# Patient Record
Sex: Female | Born: 1965 | Race: Black or African American | Hispanic: No | Smoking: Never smoker
Health system: Southern US, Community
[De-identification: ages and names within clinical notes are randomized; demographics above are authoritative.]

## PROBLEM LIST (undated history)

## (undated) DIAGNOSIS — D649 Anemia, unspecified: Secondary | ICD-10-CM

## (undated) DIAGNOSIS — E785 Hyperlipidemia, unspecified: Secondary | ICD-10-CM

## (undated) DIAGNOSIS — IMO0002 Reserved for concepts with insufficient information to code with codable children: Secondary | ICD-10-CM

## (undated) DIAGNOSIS — K219 Gastro-esophageal reflux disease without esophagitis: Secondary | ICD-10-CM

## (undated) HISTORY — DX: Hyperlipidemia, unspecified: E78.5

## (undated) HISTORY — DX: Anemia, unspecified: D64.9

## (undated) HISTORY — DX: Reserved for concepts with insufficient information to code with codable children: IMO0002

## (undated) HISTORY — DX: Gastro-esophageal reflux disease without esophagitis: K21.9

---

## 1991-07-22 HISTORY — PX: TUBAL LIGATION: SHX77

## 2004-03-29 ENCOUNTER — Other Ambulatory Visit: Admission: RE | Admit: 2004-03-29 | Discharge: 2004-03-29 | Payer: Self-pay | Admitting: Family Medicine

## 2004-04-09 ENCOUNTER — Encounter: Admission: RE | Admit: 2004-04-09 | Discharge: 2004-04-09 | Payer: Self-pay | Admitting: Family Medicine

## 2005-04-22 ENCOUNTER — Other Ambulatory Visit: Admission: RE | Admit: 2005-04-22 | Discharge: 2005-04-22 | Payer: Self-pay | Admitting: Family Medicine

## 2009-11-02 ENCOUNTER — Emergency Department (HOSPITAL_COMMUNITY): Admission: EM | Admit: 2009-11-02 | Discharge: 2009-11-02 | Payer: Self-pay | Admitting: Emergency Medicine

## 2013-05-16 ENCOUNTER — Ambulatory Visit (INDEPENDENT_AMBULATORY_CARE_PROVIDER_SITE_OTHER): Payer: 59 | Admitting: Emergency Medicine

## 2013-05-16 VITALS — BP 127/92 | HR 99 | Temp 98.3°F | Resp 18 | Ht 65.0 in | Wt 139.0 lb

## 2013-05-16 DIAGNOSIS — D649 Anemia, unspecified: Secondary | ICD-10-CM

## 2013-05-16 DIAGNOSIS — R112 Nausea with vomiting, unspecified: Secondary | ICD-10-CM

## 2013-05-16 DIAGNOSIS — E860 Dehydration: Secondary | ICD-10-CM

## 2013-05-16 DIAGNOSIS — R000 Tachycardia, unspecified: Secondary | ICD-10-CM

## 2013-05-16 LAB — POCT CBC
Hemoglobin: 9.6 g/dL — AB (ref 12.2–16.2)
Lymph, poc: 2.7 (ref 0.6–3.4)
MCH, POC: 28.2 pg (ref 27–31.2)
Platelet Count, POC: 143 10*3/uL (ref 142–424)
RBC: 3.4 M/uL — AB (ref 4.04–5.48)
RDW, POC: 13.3 %

## 2013-05-16 LAB — POCT UA - MICROSCOPIC ONLY
Mucus, UA: NEGATIVE
Yeast, UA: NEGATIVE

## 2013-05-16 LAB — POCT URINALYSIS DIPSTICK
Bilirubin, UA: NEGATIVE
Glucose, UA: NEGATIVE
Nitrite, UA: NEGATIVE
Protein, UA: NEGATIVE
pH, UA: 5.5

## 2013-05-16 LAB — IFOBT (OCCULT BLOOD): IFOBT: POSITIVE

## 2013-05-16 MED ORDER — RANITIDINE HCL 150 MG PO TABS: 300.0000 mg | ORAL_TABLET | Freq: Two times a day (BID) | ORAL | Status: DC

## 2013-05-16 NOTE — Progress Notes (Signed)
  Subjective:    Patient ID: BRIAN KOCOUREK, female    DOB: 03/13/1966, 47 y.o.   MRN: [id number]  HPI Patients presents today complaining of Nausea, vomiting, diarrhea and dizziness. This all started last night around mid night. She has a rapid heartbeat today and still complains of dizziness. He is employed at an [employer]. She has not ever had this happen in the past. Her stomach does not hurt.     Review of Systems     Objective:   Physical Exam patient is alert and cooperative. Her neck is supple. Her chest was clear to auscultation and percussion. Heart was a rapid rate and regular. The abdomen is soft nontender. Liver spleen are not enlarged   Results for orders placed in visit on 05/16/13  POCT CBC      Result Value Range   WBC 10.2  4.6 - 10.2 K/uL   Lymph, poc 2.7  0.6 - 3.4   POC LYMPH PERCENT 26.7  10 - 50 %L   MID (cbc) 0.4  0 - 0.9   POC MID % 4.1  0 - 12 %M   POC Granulocyte 7.1 (*) 2 - 6.9   Granulocyte percent 69.2  37 - 80 %G   RBC 3.40 (*) 4.04 - 5.48 M/uL   Hemoglobin 9.6 (*) 12.2 - 16.2 g/dL   HCT, POC 40.9 (*) 81.1 - 47.9 %   MCV 91.3  80 - 97 fL   MCH, POC 28.2  27 - 31.2 pg   MCHC 31.0 (*) 31.8 - 35.4 g/dL   RDW, POC 91.4     Platelet Count, POC 143  142 - 424 K/uL   MPV 13.3  0 - 99.8 fL  IFOBT (OCCULT BLOOD)      Result Value Range   IFOBT Positive          Assessment & Plan:  Patient presents with one episode of nausea vomiting diarrhea. She denies any blood in her stool. She does have a heme positive stool and hemoglobin of 9.6 with normal indices . Patient will force fluids tonight she will followup with Dr. Loreta Ave tomorrow morning. She was given one dose of Zantac 300 mg here tonight .

## 2013-05-17 LAB — COMPREHENSIVE METABOLIC PANEL
AST: 23 U/L (ref 0–37)
Albumin: 4.4 g/dL (ref 3.5–5.2)
BUN: 22 mg/dL (ref 6–23)
CO2: 24 mEq/L (ref 19–32)
Calcium: 9.8 mg/dL (ref 8.4–10.5)
Chloride: 105 mEq/L (ref 96–112)
Glucose, Bld: 102 mg/dL — ABNORMAL HIGH (ref 70–99)
Potassium: 4.2 mEq/L (ref 3.5–5.3)
Sodium: 140 mEq/L (ref 135–145)
Total Protein: 7.3 g/dL (ref 6.0–8.3)

## 2013-05-17 LAB — IRON AND TIBC
%SAT: 24 % (ref 20–55)
TIBC: 327 ug/dL (ref 250–470)
UIBC: 249 ug/dL (ref 125–400)

## 2013-05-26 ENCOUNTER — Other Ambulatory Visit: Payer: Self-pay

## 2014-02-14 ENCOUNTER — Encounter: Payer: BC Managed Care – PPO | Admitting: Emergency Medicine

## 2014-02-20 ENCOUNTER — Encounter: Payer: Self-pay | Admitting: Emergency Medicine

## 2014-03-03 ENCOUNTER — Ambulatory Visit (INDEPENDENT_AMBULATORY_CARE_PROVIDER_SITE_OTHER): Payer: BC Managed Care – PPO | Admitting: Emergency Medicine

## 2014-03-03 ENCOUNTER — Encounter: Payer: Self-pay | Admitting: Emergency Medicine

## 2014-03-03 VITALS — BP 110/72 | HR 76 | Temp 97.6°F | Resp 16 | Ht 65.0 in | Wt 147.0 lb

## 2014-03-03 DIAGNOSIS — B9681 Helicobacter pylori [H. pylori] as the cause of diseases classified elsewhere: Secondary | ICD-10-CM | POA: Insufficient documentation

## 2014-03-03 DIAGNOSIS — Z23 Encounter for immunization: Secondary | ICD-10-CM

## 2014-03-03 DIAGNOSIS — N951 Menopausal and female climacteric states: Secondary | ICD-10-CM

## 2014-03-03 DIAGNOSIS — A048 Other specified bacterial intestinal infections: Secondary | ICD-10-CM

## 2014-03-03 DIAGNOSIS — R82998 Other abnormal findings in urine: Secondary | ICD-10-CM

## 2014-03-03 DIAGNOSIS — R8281 Pyuria: Secondary | ICD-10-CM

## 2014-03-03 DIAGNOSIS — Z1239 Encounter for other screening for malignant neoplasm of breast: Secondary | ICD-10-CM

## 2014-03-03 DIAGNOSIS — K2970 Gastritis, unspecified, without bleeding: Secondary | ICD-10-CM

## 2014-03-03 DIAGNOSIS — Z1211 Encounter for screening for malignant neoplasm of colon: Secondary | ICD-10-CM

## 2014-03-03 DIAGNOSIS — Z0000 Encounter for general adult medical examination without abnormal findings: Secondary | ICD-10-CM

## 2014-03-03 DIAGNOSIS — K2940 Chronic atrophic gastritis without bleeding: Secondary | ICD-10-CM

## 2014-03-03 DIAGNOSIS — D62 Acute posthemorrhagic anemia: Secondary | ICD-10-CM | POA: Insufficient documentation

## 2014-03-03 DIAGNOSIS — K279 Peptic ulcer, site unspecified, unspecified as acute or chronic, without hemorrhage or perforation: Secondary | ICD-10-CM

## 2014-03-03 LAB — COMPLETE METABOLIC PANEL WITH GFR
ALBUMIN: 4.5 g/dL (ref 3.5–5.2)
ALK PHOS: 58 U/L (ref 39–117)
ALT: 16 U/L (ref 0–35)
AST: 21 U/L (ref 0–37)
BILIRUBIN TOTAL: 0.6 mg/dL (ref 0.2–1.2)
BUN: 12 mg/dL (ref 6–23)
CALCIUM: 9.7 mg/dL (ref 8.4–10.5)
CHLORIDE: 106 meq/L (ref 96–112)
CO2: 29 mEq/L (ref 19–32)
CREATININE: 1.09 mg/dL (ref 0.50–1.10)
GFR, Est African American: 69 mL/min
GFR, Est Non African American: 60 mL/min
Glucose, Bld: 94 mg/dL (ref 70–99)
POTASSIUM: 4.8 meq/L (ref 3.5–5.3)
SODIUM: 143 meq/L (ref 135–145)
Total Protein: 7.9 g/dL (ref 6.0–8.3)

## 2014-03-03 LAB — CBC WITH DIFFERENTIAL/PLATELET
BASOS ABS: 0 10*3/uL (ref 0.0–0.1)
BASOS PCT: 0 % (ref 0–1)
Eosinophils Absolute: 0.1 10*3/uL (ref 0.0–0.7)
Eosinophils Relative: 2 % (ref 0–5)
HCT: 39.4 % (ref 36.0–46.0)
HEMOGLOBIN: 13 g/dL (ref 12.0–15.0)
LYMPHS ABS: 1.5 10*3/uL (ref 0.7–4.0)
Lymphocytes Relative: 34 % (ref 12–46)
MCH: 28.6 pg (ref 26.0–34.0)
MCHC: 33 g/dL (ref 30.0–36.0)
MCV: 86.6 fL (ref 78.0–100.0)
MONOS PCT: 7 % (ref 3–12)
Monocytes Absolute: 0.3 10*3/uL (ref 0.1–1.0)
Neutro Abs: 2.6 10*3/uL (ref 1.7–7.7)
Neutrophils Relative %: 57 % (ref 43–77)
PLATELETS: 162 10*3/uL (ref 150–400)
RBC: 4.55 MIL/uL (ref 3.87–5.11)
RDW: 13.5 % (ref 11.5–15.5)
WBC: 4.5 10*3/uL (ref 4.0–10.5)

## 2014-03-03 LAB — POCT URINALYSIS DIPSTICK
Bilirubin, UA: NEGATIVE
GLUCOSE UA: NEGATIVE
KETONES UA: NEGATIVE
NITRITE UA: NEGATIVE
PH UA: 7
Protein, UA: NEGATIVE
Spec Grav, UA: <=1.005
UROBILINOGEN UA: 0.2

## 2014-03-03 LAB — LIPID PANEL
CHOL/HDL RATIO: 2.2 ratio
Cholesterol: 206 mg/dL — ABNORMAL HIGH (ref 0–200)
HDL: 93 mg/dL (ref >39)
LDL CALC: 103 mg/dL — AB (ref 0–99)
TRIGLYCERIDES: 49 mg/dL (ref <150)
VLDL: 10 mg/dL (ref 0–40)

## 2014-03-03 NOTE — Progress Notes (Deleted)
   Subjective:    Patient ID: Lacey Herrera, female    DOB: [birth year], 48 y.o.   MRN: [id number]  HPI    Review of Systems  Constitutional: Negative.   HENT: Negative.   Eyes: Negative.   Respiratory: Negative.   Cardiovascular: Negative.   Gastrointestinal: Negative.   Endocrine: Negative.   Genitourinary: Negative.   Musculoskeletal: Negative.   Skin: Negative.   Allergic/Immunologic: Negative.   Neurological: Negative.   Hematological: Negative.   Psychiatric/Behavioral: Negative.        Objective:   Physical Exam        Assessment & Plan:

## 2014-03-03 NOTE — Progress Notes (Signed)
@[email or web address] @  Patient ID: Lacey Herrera MRN: [id number], DOB: Oct 28, 1965, 48 y.o. Date of Encounter: 03/03/2014, 11:07 AM  Primary Physician: No PCP Per Patient  Chief Complaint: Physical (CPE)  HPI: 48 y.o. y/o female with history of noted below here for CPE.  Doing well. No issues/complaints.  Pap last done 5 years ago MMG: Not sure when this was last Review of Systems:  Consitutional: No fever, chills, fatigue, night sweats, lymphadenopathy, or weight changes. Eyes: No visual changes, eye redness, or discharge. ENT/Mouth: Ears: No otalgia, tinnitus, hearing loss, discharge. Nose: No congestion, rhinorrhea, sinus pain, or epistaxis. Throat: No sore throat, post nasal drip, or teeth pain. Cardiovascular: No CP, palpitations, diaphoresis, DOE, edema, orthopnea, PND. Respiratory: No shortness of breath, no wheezing, no cough. Gastrointestinal: No anorexia, dysphagia, reflux, pain, nausea, vomiting, hematemesis, diarrhea, constipation, BRBPR, or melena. Breast: No discharge, pain, swelling, or mass. Genitourinary: No dysuria, frequency, urgency, hematuria, incontinence, nocturia, amenorrhea, vaginal discharge, pruritis, burning, abnormal bleeding, or pain. Musculoskeletal: No decreased ROM, myalgias, stiffness, joint swelling, or weakness. Skin: No rash, erythema, lesion changes, pain, warmth, jaundice, or pruritis. Neurological: No headache, dizziness, syncope, seizures, tremors, memory loss, coordination problems, or paresthesias. Psychological: No anxiety, depression, hallucinations, SI/HI. Endocrine: No fatigue, polydipsia, polyphagia, polyuria, or known diabetes. All other systems were reviewed and are otherwise negative.  Past Medical History  Diagnosis Date  . Ulcer      History reviewed. No pertinent past surgical history.  Home Meds:  Prior to Admission medications   Medication Sig Start Date End Date Taking? Authorizing Provider  OVER THE COUNTER MEDICATION Fibergummies  taking 2 a day   Yes Historical Provider, MD  OVER THE COUNTER MEDICATION 5 Alive gummies taking 2 a day   Yes Historical Provider, MD  ranitidine (ZANTAC) 150 MG tablet Take 2 tablets (300 mg total) by mouth 2 (two) times daily. 05/16/13  Yes Darlyne Russian, MD    Allergies: No Known Allergies  History   Social History  . Marital Status: Married    Spouse Name: N/A    Number of Children: N/A  . Years of Education: N/A   Occupational History  . Not on file.   Social History Main Topics  . Smoking status: Never Smoker   . Smokeless tobacco: Not on file  . Alcohol Use: Not on file     Comment: 4 drinks  . Drug Use: Not on file  . Sexual Activity: Not on file   Other Topics Concern  . Not on file   Social History Narrative   Married. Exercise:Yes.    Family History  Problem Relation Age of Onset  . Hypertension Mother     Physical Exam  Blood pressure 110/72, pulse 76, temperature 97.6 F (36.4 C), temperature source Oral, resp. rate 16, height 5\' 5"  (1.651 m), weight 147 lb (66.679 kg), SpO2 100.00%., Body mass index is 24.46 kg/(m^2). General: Well developed, well nourished, in no acute distress. HEENT: Normocephalic, atraumatic. Conjunctiva pink, sclera non-icteric. Pupils 2 mm constricting to 1 mm, round, regular, and equally reactive to light and accomodation. EOMI. Internal auditory canal clear. TMs with good cone of light and without pathology. Nasal mucosa pink. Nares are without discharge. No sinus tenderness. Oral mucosa pink. Dentition. Pharynx without exudate.   Neck: Supple. Trachea midline. No thyromegaly. Full ROM. No lymphadenopathy. Lungs: Clear to auscultation bilaterally without wheezes, rales, or rhonchi. Breathing is of normal effort and unlabored. Cardiovascular: RRR with S1 S2. No murmurs, rubs, or gallops appreciated.  Distal pulses 2+ symmetrically. No carotid or abdominal bruits. Breast: There are no masses palpable. Abdomen: Soft, non-tender,  non-distended with normoactive bowel sounds. No hepatosplenomegaly or masses. No rebound/guarding. No CVA tenderness. Without hernias.  Genitourinary:  Normal female no adnexal masses ovaries not enlarged Musculoskeletal: Full range of motion and 5/5 strength throughout. Without swelling, atrophy, tenderness, crepitus, or warmth. Extremities without clubbing, cyanosis, or edema. Calves supple. Skin: Warm and moist without erythema, ecchymosis, wounds, or rash. Neuro: A+Ox3. CN II-XII grossly intact. Moves all extremities spontaneously. Full sensation throughout. Normal gait. DTR 2+ throughout upper and lower extremities. Finger to nose intact. Psych:  Responds to questions appropriately with a normal affect.        Assessment/Plan:  48 y.o. y/o female here for CPE patient exercises regular appears in great health. There were some cervical erosion is present and in the distant past she had an abnormal Pap but not recently .  -  Signed, Nena Jordan, MD 03/03/2014 11:07 AM

## 2014-03-05 LAB — URINE CULTURE

## 2014-03-06 ENCOUNTER — Encounter: Payer: Self-pay | Admitting: *Deleted

## 2014-03-06 LAB — PAP IG W/ RFLX HPV ASCU

## 2014-03-15 ENCOUNTER — Telehealth: Payer: Self-pay | Admitting: Radiology

## 2014-03-15 ENCOUNTER — Encounter: Payer: Self-pay | Admitting: *Deleted

## 2014-03-15 NOTE — Telephone Encounter (Signed)
Pt called inquiring about the mammogram we were referring her for. I can see where it has been ordered, but can't tell anything besides that. Can you please call her and let her know? [phone]

## 2014-04-04 ENCOUNTER — Ambulatory Visit
Admission: RE | Admit: 2014-04-04 | Discharge: 2014-04-04 | Disposition: A | Discharge status: Home / Self Care | Payer: BC Managed Care – PPO | Source: Ambulatory Visit | Attending: Emergency Medicine | Admitting: Emergency Medicine

## 2014-04-04 DIAGNOSIS — Z1239 Encounter for other screening for malignant neoplasm of breast: Secondary | ICD-10-CM

## 2014-04-05 NOTE — Telephone Encounter (Signed)
Looks like pt went yesterday for her mammogram and it's just not resulted yet. Called pt to verify that she did go. LMOM to CB

## 2014-04-05 NOTE — Telephone Encounter (Signed)
Message copied by Constance Goltz on Wed Apr 05, 2014  6:32 PM ------      Message from: Arlyss Queen A      Created: Wed Apr 05, 2014  7:28 AM       Please check with patient and be sure she follows through and has her mammography .      ----- Message -----         From: SYSTEM         Sent: 04/05/2014  12:01 AM           To: Darlyne Russian, MD                   ------

## 2014-04-18 ENCOUNTER — Other Ambulatory Visit: Payer: Self-pay | Admitting: Emergency Medicine

## 2014-04-18 DIAGNOSIS — R928 Other abnormal and inconclusive findings on diagnostic imaging of breast: Secondary | ICD-10-CM

## 2014-04-21 ENCOUNTER — Ambulatory Visit
Admission: RE | Admit: 2014-04-21 | Discharge: 2014-04-21 | Disposition: A | Discharge status: Home / Self Care | Payer: BC Managed Care – PPO | Source: Ambulatory Visit | Attending: Emergency Medicine | Admitting: Emergency Medicine

## 2014-04-21 DIAGNOSIS — R928 Other abnormal and inconclusive findings on diagnostic imaging of breast: Secondary | ICD-10-CM

## 2014-05-05 ENCOUNTER — Other Ambulatory Visit: Payer: Self-pay

## 2015-10-01 ENCOUNTER — Ambulatory Visit (INDEPENDENT_AMBULATORY_CARE_PROVIDER_SITE_OTHER): Payer: BC Managed Care – PPO | Admitting: Physician Assistant

## 2015-10-01 VITALS — BP 130/80 | HR 92 | Temp 98.3°F | Resp 16 | Ht 65.0 in | Wt 145.8 lb

## 2015-10-01 DIAGNOSIS — N950 Postmenopausal bleeding: Secondary | ICD-10-CM

## 2015-10-01 DIAGNOSIS — N760 Acute vaginitis: Secondary | ICD-10-CM

## 2015-10-01 DIAGNOSIS — A499 Bacterial infection, unspecified: Secondary | ICD-10-CM

## 2015-10-01 DIAGNOSIS — B9689 Other specified bacterial agents as the cause of diseases classified elsewhere: Secondary | ICD-10-CM

## 2015-10-01 LAB — POCT URINALYSIS DIP (MANUAL ENTRY)
BILIRUBIN UA: NEGATIVE
BILIRUBIN UA: NEGATIVE
GLUCOSE UA: NEGATIVE
Leukocytes, UA: NEGATIVE
Nitrite, UA: NEGATIVE
Protein Ur, POC: NEGATIVE
SPEC GRAV UA: 1.01
UROBILINOGEN UA: 0.2
pH, UA: 5.5

## 2015-10-01 LAB — POC MICROSCOPIC URINALYSIS (UMFC): Mucus: ABSENT

## 2015-10-01 LAB — POCT WET + KOH PREP
Trich by wet prep: ABSENT
YEAST BY KOH: ABSENT
YEAST BY WET PREP: ABSENT

## 2015-10-01 MED ORDER — METRONIDAZOLE 500 MG PO TABS
500.0000 mg | ORAL_TABLET | Freq: Two times a day (BID) | ORAL | Status: DC
Start: 2015-10-01 — End: 2015-10-19

## 2015-10-01 NOTE — Progress Notes (Signed)
   10/01/2015 7:51 PM   DOB: 1966/01/22 / MRN: [id number]  SUBJECTIVE:  Lacey Herrera is a 50 y.o. female presenting for "spotting" that occurred yesterday. She has been post menopausal since late 2015. Her last PAP was normal in 2015.   She denies abnormal vaginal discharge today.  Reports malodorous urine.   She denies abdominal pain and constipation.    She has No Known Allergies.   She  has a past medical history of Ulcer.    She  reports that she has never smoked. She does not have any smokeless tobacco history on file. She  has no sexual activity history on file. The patient  has no past surgical history on file.  Her family history includes Hypertension in her mother.  Review of Systems  Constitutional: Negative for fever.  Genitourinary: Negative for hematuria.  Skin: Negative for rash.  Neurological: Negative for dizziness and headaches.  Endo/Heme/Allergies: Negative for environmental allergies.    Problem list and medications reviewed and updated by myself where necessary, and exist elsewhere in the encounter.   OBJECTIVE:  BP 130/80 mmHg  Pulse 92  Temp(Src) 98.3 F (36.8 C) (Oral)  Resp 16  Ht 5\' 5"  (1.651 m)  Wt 145 lb 12.8 oz (66.134 kg)  BMI 24.26 kg/m2  Physical Exam  Constitutional: She is oriented to person, place, and time. She appears well-nourished. No distress.  Eyes: EOM are normal. Pupils are equal, round, and reactive to light.  Cardiovascular: Normal rate.   Pulmonary/Chest: Effort normal.  Abdominal: She exhibits no distension.  Genitourinary: Vagina normal. Cervix exhibits no motion tenderness, no discharge and no friability. Right adnexum displays no mass and no tenderness. Left adnexum displays no mass and no tenderness. No erythema or tenderness in the vagina. No signs of injury around the vagina.    Fishy odor present.   Neurological: She is alert and oriented to person, place, and time. No cranial nerve deficit. Gait normal.  Skin: Skin is  dry. She is not diaphoretic.  Psychiatric: She has a normal mood and affect.  Vitals reviewed.   No results found for this or any previous visit (from the past 72 hour(s)).  No results found.  ASSESSMENT AND PLAN  Lacey Herrera was seen today for vaginal bleeding.  Diagnoses and all orders for this visit:  Post-menopausal bleeding: She is asymptomatic aside from bloody vaginal discharge.  Will send her to Dr. Phineas Real for a more through evaluation. She did have some clue cells present on wet prep.  -     POCT urinalysis dipstick -     POCT Microscopic Urinalysis ([facility]) -     POCT Wet + KOH Prep -     Pap IG, CT/NG w/ reflex HPV when ASC-U    The patient was advised to call or return to clinic if she does not see an improvement in symptoms or to seek the care of the closest emergency department if she worsens with the above plan.   Philis Fendt, MHS, PA-C [facility] 10/01/2015 7:51 PM

## 2015-10-01 NOTE — Patient Instructions (Signed)
     IF you received an x-ray today, you will receive an invoice from [facility]. Please contact [facility] at [phone] with questions or concerns regarding your invoice.   IF you received labwork today, you will receive an invoice from [facility]. Please contact [facility] at [phone] with questions or concerns regarding your invoice.   Our billing staff will not be able to assist you with questions regarding bills from these companies.  You will be contacted with the lab results as soon as they are available. The fastest way to get your results is to activate your My Chart account. Instructions are located on the last page of this paperwork. If you have not heard from us regarding the results in 2 weeks, please contact this office.      

## 2015-10-04 LAB — PAP IG, CT-NG, RFX HPV ASCU
Chlamydia Probe Amp: NOT DETECTED
GC PROBE AMP: NOT DETECTED

## 2015-10-19 ENCOUNTER — Ambulatory Visit (INDEPENDENT_AMBULATORY_CARE_PROVIDER_SITE_OTHER): Payer: BC Managed Care – PPO | Admitting: Gynecology

## 2015-10-19 ENCOUNTER — Encounter: Payer: Self-pay | Admitting: Gynecology

## 2015-10-19 VITALS — BP 124/80 | Ht 65.0 in | Wt 144.0 lb

## 2015-10-19 DIAGNOSIS — N950 Postmenopausal bleeding: Secondary | ICD-10-CM | POA: Diagnosis not present

## 2015-10-19 DIAGNOSIS — N888 Other specified noninflammatory disorders of cervix uteri: Secondary | ICD-10-CM | POA: Diagnosis not present

## 2015-10-19 NOTE — Patient Instructions (Signed)
Office will call you with biopsy results.  Follow-up for ultrasound as scheduled. 

## 2015-10-19 NOTE — Progress Notes (Signed)
    Lacey Herrera 12-04-1965 [id number]        50 y.o.  G3P0003 new patient seen in consultation from Philis Fendt, PA-C from [facility] who presented to him with single episode of postmenopausal bleeding several weeks ago. No pain, bloating breast tenderness proceeding. LMP reported close to a year ago. No other bleeding. His exam noted some blood emanating from the cervix.  Pap smear done at that time was adequate normal.  Past medical history,surgical history, problem list, medications, allergies, family history and social history were all reviewed and documented in the EPIC chart.  Directed ROS with pertinent positives and negatives documented in the history of present illness/assessment and plan.  Exam: Wandra Scot assistant Filed Vitals:   10/19/15 1559  BP: 124/80  Height: 5\' 5"  (1.651 m)  Weight: 144 lb (65.318 kg)   General appearance:  Normal Abdomen soft nontender without masses guarding rebound Pelvic external BUS vagina with mild atrophic changes. Cervix with a bulbous polypoid mass 6:00 position. Slight spotting from this area with Q-tip manipulation. Uterus grossly normal midline mobile nontender. Adnexa without masses or tenderness.  Colposcopy was performed after acetic acid cleanse which was adequate and no abnormalities noted other than this bulbous area at 6:00 transformation zone. Appears covered with normal squamous epithelium. The base of this area was infiltrated with 1% Xylocaine and the area was biopsied off in its entirety noting mucoid material extruding consistent with a nabothian cyst. Monsel's solution applied for hemostasis. Specimen sent to pathology.  Assessment/Plan:  50 y.o. G3P0003 patient with episode of vaginal bleeding after approaching one year of no menses. Cervix showed probable nabothian cyst with some friability on manipulation. Specimen sent to pathology and patient will follow up for results. Pap smear done was normal. Recommend sonohysterogram for  completeness to rule out higher level abnormalities such as polyps and allow for endometrial sampling. Assuming all negative then I suspect she either had an escape ovulation or the cervical cyst as a source of her bleeding. Patient will schedule and follow up for her ultrasound.    Anastasio Auerbach MD, 4:28 PM 10/19/2015

## 2015-10-23 ENCOUNTER — Other Ambulatory Visit: Payer: Self-pay | Admitting: Gynecology

## 2015-10-23 DIAGNOSIS — N950 Postmenopausal bleeding: Secondary | ICD-10-CM

## 2015-11-13 ENCOUNTER — Telehealth: Payer: Self-pay | Admitting: Gynecology

## 2015-11-13 NOTE — Telephone Encounter (Signed)
11/13/15-Pt was advised today that her [facility] insurance will cover the sonohysterogram and bx if needed under her $25.00 copay. Per [id number]

## 2015-11-22 ENCOUNTER — Ambulatory Visit: Payer: BC Managed Care – PPO | Admitting: Gynecology

## 2015-11-22 ENCOUNTER — Other Ambulatory Visit: Payer: BC Managed Care – PPO

## 2015-12-26 ENCOUNTER — Encounter: Payer: Self-pay | Admitting: Gynecology

## 2015-12-26 ENCOUNTER — Ambulatory Visit (INDEPENDENT_AMBULATORY_CARE_PROVIDER_SITE_OTHER): Payer: BC Managed Care – PPO

## 2015-12-26 ENCOUNTER — Other Ambulatory Visit: Payer: Self-pay | Admitting: Gynecology

## 2015-12-26 ENCOUNTER — Ambulatory Visit (INDEPENDENT_AMBULATORY_CARE_PROVIDER_SITE_OTHER): Payer: BC Managed Care – PPO | Admitting: Gynecology

## 2015-12-26 VITALS — BP 116/78

## 2015-12-26 DIAGNOSIS — N950 Postmenopausal bleeding: Secondary | ICD-10-CM | POA: Diagnosis not present

## 2015-12-26 DIAGNOSIS — D251 Intramural leiomyoma of uterus: Secondary | ICD-10-CM

## 2015-12-26 DIAGNOSIS — D252 Subserosal leiomyoma of uterus: Secondary | ICD-10-CM | POA: Diagnosis not present

## 2015-12-26 NOTE — Patient Instructions (Signed)
Office will call you with biopsy results 

## 2015-12-26 NOTE — Progress Notes (Addendum)
    Lacey Herrera 1965-10-06 [id number]        50 y.o.  G3P0003 presents for sonohysterogram secondary to a history of single episode of postmenopausal bleeding. Has done no further bleeding since initial episode  Past medical history,surgical history, problem list, medications, allergies, family history and social history were all reviewed and documented in the EPIC chart.  Directed ROS with pertinent positives and negatives documented in the history of present illness/assessment and plan.  Exam: [place] assistant Filed Vitals:   12/26/15 1650  BP: 116/78   General appearance:  Normal Abdomen soft nontender without masses guarding rebound Pelvic external BUS vagina normal. Cervix normal. Uterus grossly normal size midline mobile nontender. Adnexa without masses or tenderness.  Ultrasound shows uterus grossly normal in size with multiple small myomas ranging from 33 mm,  27 mm, 23 mm, 18 mm, 15 mm. Endometrial echo 2.2 mm. Right and left ovaries visualized and normal. Cul-de-sac negative.  Sonohysterogram performed, sterile technique, easy catheter introduction, good distention with no abnormalities. Endometrial sample taken with scant return noted.   Assessment/Plan:  50 y.o. G3P0003  with single episode of post menopausal bleeding. Ultrasound shows multiple small myomas. Endometrial cavity is negative with thin endometrial echo. Reviewed with patient potential for scant or inadequate return on biopsy pathology. Assuming it which show inadequate specimen I think this is appropriate given the thin endometrial echo and we would plan expectant management.    Anastasio Auerbach MD, 4:56 PM 12/26/2015

## 2016-05-08 ENCOUNTER — Other Ambulatory Visit: Payer: Self-pay | Admitting: Emergency Medicine

## 2016-05-08 DIAGNOSIS — Z1231 Encounter for screening mammogram for malignant neoplasm of breast: Secondary | ICD-10-CM

## 2016-05-27 ENCOUNTER — Ambulatory Visit
Admission: RE | Admit: 2016-05-27 | Discharge: 2016-05-27 | Disposition: A | Discharge status: Home / Self Care | Payer: BC Managed Care – PPO | Source: Ambulatory Visit | Attending: Emergency Medicine | Admitting: Emergency Medicine

## 2016-05-27 DIAGNOSIS — Z1231 Encounter for screening mammogram for malignant neoplasm of breast: Secondary | ICD-10-CM

## 2016-05-29 ENCOUNTER — Other Ambulatory Visit: Payer: Self-pay | Admitting: Emergency Medicine

## 2016-05-29 DIAGNOSIS — R928 Other abnormal and inconclusive findings on diagnostic imaging of breast: Secondary | ICD-10-CM

## 2016-05-30 ENCOUNTER — Telehealth: Payer: Self-pay | Admitting: Emergency Medicine

## 2016-05-30 NOTE — Telephone Encounter (Signed)
-----   Message from Lacey Russian, MD sent at 05/29/2016  7:45 AM EST ----- Call patient and advise her to have follow-up screening testing as recommended by the radiologist.

## 2016-06-03 ENCOUNTER — Ambulatory Visit (INDEPENDENT_AMBULATORY_CARE_PROVIDER_SITE_OTHER): Payer: BC Managed Care – PPO | Admitting: Physician Assistant

## 2016-06-03 VITALS — BP 100/68 | HR 78 | Temp 98.2°F | Ht 65.0 in | Wt 148.0 lb

## 2016-06-03 DIAGNOSIS — Z23 Encounter for immunization: Secondary | ICD-10-CM | POA: Diagnosis not present

## 2016-06-03 DIAGNOSIS — Z1159 Encounter for screening for other viral diseases: Secondary | ICD-10-CM

## 2016-06-03 DIAGNOSIS — Z114 Encounter for screening for human immunodeficiency virus [HIV]: Secondary | ICD-10-CM | POA: Diagnosis not present

## 2016-06-03 LAB — HIV ANTIBODY (ROUTINE TESTING W REFLEX): HIV: NONREACTIVE

## 2016-06-03 NOTE — Progress Notes (Signed)
   Lacey Herrera  MRN: [id number] DOB: 1966-06-15  Subjective:  Lacey Herrera is a 50 y.o. female seen in office today for a chief complaint of update on her health maintenance. She needs a flu vaccine and a hep c and HIV screen. She is not having any symptoms, she just wants her health maintenance from mychart up to date.   Of note, pt has not had an annual physical exam in over a year because of her husband being diagnosed with stage 5 kidney disease. She would like to schedule a full CPE in December.   Review of Systems  Constitutional: Negative for chills, diaphoresis, fatigue and fever.  HENT: Negative for congestion.   Respiratory: Negative for cough.   Gastrointestinal: Negative for diarrhea, nausea and vomiting.    Patient Active Problem List   Diagnosis Date Noted  . Helicobacter positive gastritis 03/03/2014  . Acute blood loss anemia 03/03/2014    Current Outpatient Prescriptions on File Prior to Visit  Medication Sig Dispense Refill  . OVER THE COUNTER MEDICATION Fibergummies taking 2 a day    . OVER THE COUNTER MEDICATION 5 Alive gummies taking 2 a day    . OVER THE COUNTER MEDICATION OTC Iron tablets taking 2 a day     No current facility-administered medications on file prior to visit.     No Known Allergies   Objective:  BP 100/68 (BP Location: Right Arm, Patient Position: Sitting, Cuff Size: Small)   Pulse 78   Temp 98.2 F (36.8 C) (Oral)   Ht 5\' 5"  (1.651 m)   Wt 148 lb (67.1 kg)   SpO2 100%   BMI 24.63 kg/m   Physical Exam  Constitutional: She is oriented to person, place, and time and well-developed, well-nourished, and in no distress.  HENT:  Head: Normocephalic and atraumatic.  Eyes: Conjunctivae are normal.  Neck: Normal range of motion.  Cardiovascular: Normal rate, regular rhythm and normal heart sounds.   Pulmonary/Chest: Effort normal and breath sounds normal.  Abdominal: Soft. Bowel sounds are normal. There is no tenderness.    Neurological: She is alert and oriented to person, place, and time. Gait normal.  Skin: Skin is warm and dry.  Psychiatric: Affect normal.  Vitals reviewed.   Assessment and Plan :   1. Need for hepatitis C screening test - Hepatitis C antibody  2. Screening for HIV (human immunodeficiency virus) - HIV antibody  3. Need for prophylactic vaccination and inoculation against influenza - Flu Vaccine QUAD 36+ mos IM  Await lab results. Pt to schedule an appointment with me in December for CPE.    Tenna Delaine PA-C  [facility] 06/03/2016 4:32 PM

## 2016-06-03 NOTE — Patient Instructions (Addendum)
  We will send your results to mychart once they are received.  Please make an appointment with me in December for a full annual exam at our 104 office.  Thank you for letting me participate in your health and well being.    IF you received an x-ray today, you will receive an invoice from [facility]. Please contact [facility] at [phone] with questions or concerns regarding your invoice.   IF you received labwork today, you will receive an invoice from [employer]. Please contact [facility] at [phone] with questions or concerns regarding your invoice.   Our billing staff will not be able to assist you with questions regarding bills from these companies.  You will be contacted with the lab results as soon as they are available. The fastest way to get your results is to activate your My Chart account. Instructions are located on the last page of this paperwork. If you have not heard from [place] regarding the results in 2 weeks, please contact this office.

## 2016-06-04 ENCOUNTER — Ambulatory Visit
Admission: RE | Admit: 2016-06-04 | Discharge: 2016-06-04 | Disposition: A | Discharge status: Home / Self Care | Payer: BC Managed Care – PPO | Source: Ambulatory Visit | Attending: Emergency Medicine | Admitting: Emergency Medicine

## 2016-06-04 DIAGNOSIS — R928 Other abnormal and inconclusive findings on diagnostic imaging of breast: Secondary | ICD-10-CM

## 2016-06-04 LAB — HEPATITIS C ANTIBODY: HCV Ab: NEGATIVE

## 2016-06-27 ENCOUNTER — Ambulatory Visit (INDEPENDENT_AMBULATORY_CARE_PROVIDER_SITE_OTHER): Payer: BC Managed Care – PPO | Admitting: Physician Assistant

## 2016-06-27 VITALS — BP 120/70 | HR 98 | Temp 98.4°F | Resp 18 | Ht 65.0 in | Wt 147.0 lb

## 2016-06-27 DIAGNOSIS — S61209A Unspecified open wound of unspecified finger without damage to nail, initial encounter: Secondary | ICD-10-CM

## 2016-06-27 NOTE — Progress Notes (Signed)
   Lacey Herrera  MRN: [id number] DOB: 20-May-1966  Subjective:  Pt presents to clinic with cut on her 3rd digit on her non-domninant hand - she was cutting something with sissors and her finger got in the way and she cut off part of the tip left middle finger.  She has local pain.  Last tetanus vaccine about 2 years ago  Review of Systems  Patient Active Problem List   Diagnosis Date Noted  . Helicobacter positive gastritis 03/03/2014  . Acute blood loss anemia 03/03/2014    Current Outpatient Prescriptions on File Prior to Visit  Medication Sig Dispense Refill  . OVER THE COUNTER MEDICATION Fibergummies taking 2 a day    . OVER THE COUNTER MEDICATION 5 Alive gummies taking 2 a day    . OVER THE COUNTER MEDICATION OTC Iron tablets taking 2 a day     No current facility-administered medications on file prior to visit.     No Known Allergies  Pt patients past, family and social history were reviewed and updated.   Objective:  BP 120/70 (BP Location: Right Arm, Patient Position: Sitting, Cuff Size: Small)   Pulse 98   Temp 98.4 F (36.9 C) (Oral)   Resp 18   Ht 5\' 5"  (1.651 m)   Wt 147 lb (66.7 kg)   SpO2 100%   BMI 24.46 kg/m   Physical Exam  Constitutional: She is oriented to person, place, and time and well-developed, well-nourished, and in no distress.  HENT:  Head: Normocephalic and atraumatic.  Right Ear: Hearing and external ear normal.  Left Ear: Hearing and external ear normal.  Eyes: Conjunctivae are normal.  Neck: Normal range of motion.  Pulmonary/Chest: Effort normal.  Neurological: She is alert and oriented to person, place, and time. Gait normal.  Skin: Skin is warm and dry.  1/2 by 1 cm skin avulsion involving the tip of her nail on the radial side  Psychiatric: Mood, memory, affect and judgment normal.  Vitals reviewed.  Procedure:  Consent obtained - local anesthesia with 2% lido plain - adipose tissue removed from center of wound - the wound is  long and narrow and the defect is 3 mm deep - the wound edges were pulled together and closed with - 5-0 ethilon #2 SI sutures - the wound was not pulled completely together to reduce long term defect but did d/w pt that she would have a defect due to missing tissue.  Wound care was d/w pt. Assessment and Plan :  Open wound of finger, initial encounter   Wound care d/w pt - f/u in 10d for suture removal - if she has problems with will recheck with me sooner.  Lacey Hummingbird PA-C  [facility] 06/27/2016 5:05 PM

## 2016-06-27 NOTE — Patient Instructions (Addendum)
WOUND CARE Please return in 10 (you have an appt at 104) days to have your stitches/staples removed or sooner if you have concerns. Marland Kitchen Keep area clean and dry for 24 hours. Do not remove bandage, if applied. . After 24 hours, remove bandage and wash wound gently with mild soap and warm water. Reapply a new bandage after cleaning wound, if directed. . Continue daily cleansing with soap and water until stitches/staples are removed. . Do not apply any ointments or creams to the wound while stitches/staples are in place, as this may cause delayed healing. . Notify the office if you experience any of the following signs of infection: Swelling, redness, pus drainage, streaking, fever >101.0 F . Notify the office if you experience excessive bleeding that does not stop after 15-20 minutes of constant, firm pressure.      IF you received an x-ray today, you will receive an invoice from [facility]. Please contact [facility] at [phone] with questions or concerns regarding your invoice.   IF you received labwork today, you will receive an invoice from [employer]. Please contact [facility] at [phone] with questions or concerns regarding your invoice.   Our billing staff will not be able to assist you with questions regarding bills from these companies.  You will be contacted with the lab results as soon as they are available. The fastest way to get your results is to activate your My Chart account. Instructions are located on the last page of this paperwork. If you have not heard from [place] regarding the results in 2 weeks, please contact this office.

## 2016-07-08 ENCOUNTER — Encounter: Payer: Self-pay | Admitting: Physician Assistant

## 2016-07-08 ENCOUNTER — Ambulatory Visit (INDEPENDENT_AMBULATORY_CARE_PROVIDER_SITE_OTHER): Payer: BC Managed Care – PPO | Admitting: Physician Assistant

## 2016-07-08 VITALS — BP 108/82 | HR 82 | Temp 98.2°F | Resp 16 | Ht 65.0 in | Wt 149.0 lb

## 2016-07-08 DIAGNOSIS — S61209D Unspecified open wound of unspecified finger without damage to nail, subsequent encounter: Secondary | ICD-10-CM

## 2016-07-08 NOTE — Progress Notes (Signed)
   Lacey Herrera  MRN: [id number] DOB: [birth year]  Subjective:  Pt presents to clinic for recheck finger - she is still having problems with slight pain esp when the finger is touched but it feels much better - the wound is healing well. She has been wearing the splint and it helps with the pain.   Review of Systems  Patient Active Problem List   Diagnosis Date Noted  . Helicobacter positive gastritis 03/03/2014  . Acute blood loss anemia 03/03/2014    Current Outpatient Prescriptions on File Prior to Visit  Medication Sig Dispense Refill  . OVER THE COUNTER MEDICATION Fibergummies taking 2 a day    . OVER THE COUNTER MEDICATION 5 Alive gummies taking 2 a day    . OVER THE COUNTER MEDICATION OTC Iron tablets taking 2 a day     No current facility-administered medications on file prior to visit.     No Known Allergies  Pt patients past, family and social history were reviewed and updated.   Objective:  BP 108/82 (BP Location: Left Arm, Patient Position: Sitting, Cuff Size: Normal)   Pulse 82   Temp 98.2 F (36.8 C) (Oral)   Resp 16   Ht 5\' 5"  (1.651 m)   Wt 149 lb (67.6 kg)   SpO2 100%   BMI 24.79 kg/m   Physical Exam  Constitutional: She is oriented to person, place, and time and well-developed, well-nourished, and in no distress.  HENT:  Head: Normocephalic and atraumatic.  Right Ear: Hearing and external ear normal.  Left Ear: Hearing and external ear normal.  Eyes: Conjunctivae are normal.  Neck: Normal range of motion.  Pulmonary/Chest: Effort normal.  Neurological: She is alert and oriented to person, place, and time. Gait normal.  Skin: Skin is warm and dry.  Well healed wound - suture knots cut off - dressing placed over wound - area is still TTP at wound area  Psychiatric: Mood, memory, affect and judgment normal.  Vitals reviewed.   Assessment and Plan :  Open wound of finger, subsequent encounter  Well healed wound -   Wound care d/w pt -  Windell Hummingbird PA-C  [facility] 07/08/2016 6:58 PM

## 2016-07-08 NOTE — Patient Instructions (Signed)
     IF you received an x-ray today, you will receive an invoice from [facility]. Please contact [facility] at [phone] with questions or concerns regarding your invoice.   IF you received labwork today, you will receive an invoice from [facility]. Please contact [facility] at [phone] with questions or concerns regarding your invoice.   Our billing staff will not be able to assist you with questions regarding bills from these companies.  You will be contacted with the lab results as soon as they are available. The fastest way to get your results is to activate your My Chart account. Instructions are located on the last page of this paperwork. If you have not heard from us regarding the results in 2 weeks, please contact this office.     

## 2017-03-11 ENCOUNTER — Ambulatory Visit (INDEPENDENT_AMBULATORY_CARE_PROVIDER_SITE_OTHER): Payer: BC Managed Care – PPO | Admitting: Physician Assistant

## 2017-03-11 ENCOUNTER — Encounter: Payer: Self-pay | Admitting: Physician Assistant

## 2017-03-11 VITALS — BP 124/82 | HR 75 | Temp 98.2°F | Resp 18 | Ht 65.75 in | Wt 151.0 lb

## 2017-03-11 DIAGNOSIS — Z23 Encounter for immunization: Secondary | ICD-10-CM

## 2017-03-11 DIAGNOSIS — Z130 Encounter for screening for diseases of the blood and blood-forming organs and certain disorders involving the immune mechanism: Secondary | ICD-10-CM | POA: Diagnosis not present

## 2017-03-11 DIAGNOSIS — Z1329 Encounter for screening for other suspected endocrine disorder: Secondary | ICD-10-CM

## 2017-03-11 DIAGNOSIS — Z0000 Encounter for general adult medical examination without abnormal findings: Secondary | ICD-10-CM

## 2017-03-11 DIAGNOSIS — Z124 Encounter for screening for malignant neoplasm of cervix: Secondary | ICD-10-CM

## 2017-03-11 DIAGNOSIS — Z13228 Encounter for screening for other metabolic disorders: Secondary | ICD-10-CM | POA: Diagnosis not present

## 2017-03-11 DIAGNOSIS — Z13220 Encounter for screening for lipoid disorders: Secondary | ICD-10-CM | POA: Diagnosis not present

## 2017-03-11 NOTE — Progress Notes (Signed)
PRIMARY CARE AT [place], [place] [id number] [phone]  Date:  03/11/2017   Name:  Lacey Herrera   DOB:  01-26-1966   MRN:  [id number]  PCP:  Patient, No Pcp Per    History of Present Illness:  Lacey Herrera is a 51 y.o. female patient who presents to PCP with  Chief Complaint  Patient presents with  . Annual Exam     DIET: SHe avoids cheese, white rice, iceberg lettuce.  She has a hx of reflux, which this sparks considerably.  Water intake: 64-80oz per day.  2 sodas per day.  1 cup coffee.  She has vegetables daily.  Rarely has red meat.    BM: normal.  No black or bloody stool.  No constipation, or diarrhea.    URINATION: normal.  No dysuria, hematuria, or frequency.  SLEEP: intermittent.  At least 7 hours per day.  Some difficulty with getting or staying asleep when stressed.    SOCIAL ACTIVITY: she walks daily.  Help with cheerleading.  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    EtOH: 2 glasses per day Illicit drug use: none Smoking or vaping: none ever Sexually active: no dyspareunia.  Not lately due to illness of the husband.   MENSES: No LMP recorded. Patient is postmenopausal.  2 years ago.  Last 49.  1 abnormal pap smear 25 years ago.  Unsure of the follow up involved. 3 children--vaginal births.  Patient is considering to be kidney donor for father.  She is requesting to be checked completely.  Patient Active Problem List   Diagnosis Date Noted  . Helicobacter positive gastritis 03/03/2014  . Acute blood loss anemia 03/03/2014    Past Medical History:  Diagnosis Date  . Ulcer     Past Surgical History:  Procedure Laterality Date  . TUBAL LIGATION  93    Social History  Substance Use Topics  . Smoking status: Never Smoker  . Smokeless tobacco: Never Used  . Alcohol use 0.0 oz/week     Comment: 4 drinks    Family History  Problem Relation Age of Onset  . Hypertension Mother   . Hypercholesterolemia Father   . Colon cancer  Maternal Grandmother     No Known Allergies  Medication list has been reviewed and updated.  Current Outpatient Prescriptions on File Prior to Visit  Medication Sig Dispense Refill  . OVER THE COUNTER MEDICATION Fibergummies taking 2 a day    . OVER THE COUNTER MEDICATION 5 Alive gummies taking 2 a day    . OVER THE COUNTER MEDICATION OTC Iron tablets taking 2 a day     No current facility-administered medications on file prior to visit.     Review of Systems  Constitutional: Negative for chills and fever.  HENT: Negative for ear discharge, ear pain and sore throat.   Eyes: Negative for blurred vision and double vision.  Respiratory: Negative for cough, shortness of breath and wheezing.   Cardiovascular: Negative for chest pain, palpitations and leg swelling.  Gastrointestinal: Negative for diarrhea, nausea and vomiting.  Genitourinary: Negative for dysuria, frequency and hematuria.  Skin: Negative for itching and rash.  Neurological: Negative for dizziness and headaches.   ROS otherwise unremarkable unless listed above.  Physical Examination: BP (!) 146/85   Pulse 75   Temp 98.2 F (36.8 C) (Oral)   Resp 18   Ht 5' 5.75" (1.67 m)   Wt 151 lb (68.5 kg)   SpO2 98%  BMI 24.56 kg/m  Ideal Body Weight: Weight in (lb) to have BMI = 25: 153.4  Physical Exam  Constitutional: She is oriented to person, place, and time. She appears well-developed and well-nourished. No distress.  HENT:  Head: Normocephalic and atraumatic.  Right Ear: Tympanic membrane, external ear and ear canal normal.  Left Ear: Tympanic membrane, external ear and ear canal normal.  Nose: Right sinus exhibits no maxillary sinus tenderness and no frontal sinus tenderness. Left sinus exhibits no maxillary sinus tenderness and no frontal sinus tenderness.  Mouth/Throat: Oropharynx is clear and moist. No uvula swelling. No oropharyngeal exudate, posterior oropharyngeal edema or posterior oropharyngeal erythema.   Eyes: Pupils are equal, round, and reactive to light. Conjunctivae and EOM are normal.  Neck: Normal range of motion. Neck supple. No thyromegaly present.  Cardiovascular: Normal rate, regular rhythm, normal heart sounds and intact distal pulses.  Exam reveals no gallop, no distant heart sounds and no friction rub.   No murmur heard. Pulmonary/Chest: Effort normal and breath sounds normal. No respiratory distress. She has no decreased breath sounds. She has no wheezes. She has no rhonchi.  Abdominal: Soft. Bowel sounds are normal. She exhibits no distension and no mass. There is no tenderness.  Genitourinary: Vagina normal. Pelvic exam was performed with patient supine. Right adnexum displays no mass. Left adnexum displays no mass. No vaginal discharge found.  Genitourinary Comments: Cervix is friable however no lesions apparent.  Musculoskeletal: Normal range of motion. She exhibits no edema or tenderness.  Lymphadenopathy:       Head (right side): No submandibular, no tonsillar, no preauricular and no posterior auricular adenopathy present.       Head (left side): No submandibular, no tonsillar, no preauricular and no posterior auricular adenopathy present.    She has no cervical adenopathy.  Neurological: She is alert and oriented to person, place, and time. No cranial nerve deficit. She exhibits normal muscle tone. Coordination normal.  Skin: Skin is warm and dry. She is not diaphoretic.  Psychiatric: She has a normal mood and affect. Her behavior is normal.     Assessment and Plan: Lacey Herrera is a 51 y.o. female who is here today for cc of annual physical exam.  Annual physical exam - Plan: CBC, CMP14+EGFR, TSH, Lipid panel, Pap IG w/ reflex to HPV when ASC-U, Flu Vaccine QUAD 36+ mos IM  Screening for cervical cancer - Plan: Pap IG w/ reflex to HPV when ASC-U  Screening for metabolic disorder - Plan: CMP14+EGFR  Screening for thyroid disorder - Plan: TSH  Need for lipid  screening - Plan: Lipid panel  Need for influenza vaccination - Plan: Flu Vaccine QUAD 36+ mos IM  Screening for deficiency anemia - Plan: CBC  Ivar Drape, PA-C [facility] 8/22/20188:05 PM

## 2017-03-11 NOTE — Patient Instructions (Addendum)
   IF you received an x-ray today, you will receive an invoice from [facility]. Please contact [facility] at [phone] with questions or concerns regarding your invoice.   IF you received labwork today, you will receive an invoice from [facility]. Please contact [facility] at [phone] with questions or concerns regarding your invoice.   Our billing staff will not be able to assist you with questions regarding bills from these companies.  You will be contacted with the lab results as soon as they are available. The fastest way to get your results is to activate your My Chart account. Instructions are located on the last page of this paperwork. If you have not heard from us regarding the results in 2 weeks, please contact this office.     Keeping You Healthy  Get These Tests  Blood Pressure- Have your blood pressure checked by your healthcare provider at least once a year.  Normal blood pressure is 120/80.  Weight- Have your body mass index (BMI) calculated to screen for obesity.  BMI is a measure of body fat based on height and weight.  You can calculate your own BMI at www.nhlbisupport.com/bmi/  Cholesterol- Have your cholesterol checked every year.  Diabetes- Have your blood sugar checked every year if you have high blood pressure, high cholesterol, a family history of diabetes or if you are overweight.  Pap Test - Have a pap test every 1 to 5 years if you have been sexually active.  If you are older than 65 and recent pap tests have been normal you may not need additional pap tests.  In addition, if you have had a hysterectomy  for benign disease additional pap tests are not necessary.  Mammogram-Yearly mammograms are essential for early detection of breast cancer  Screening for Colon Cancer- Colonoscopy starting at age 50. Screening may begin sooner depending on your family history and other health conditions.  Follow up colonoscopy as directed by your  Gastroenterologist.  Screening for Osteoporosis- Screening begins at age 65 with bone density scanning, sooner if you are at higher risk for developing Osteoporosis.  Get these medicines  Calcium with Vitamin D- Your body requires 1200-1500 mg of Calcium a day and 800-1000 IU of Vitamin D a day.  You can only absorb 500 mg of Calcium at a time therefore Calcium must be taken in 2 or 3 separate doses throughout the day.  Hormones- Hormone therapy has been associated with increased risk for certain cancers and heart disease.  Talk to your healthcare provider about if you need relief from menopausal symptoms.  Aspirin- Ask your healthcare provider about taking Aspirin to prevent Heart Disease and Stroke.  Get these Immuniztions  Flu shot- Every fall  Pneumonia shot- Once after the age of 65; if you are younger ask your healthcare provider if you need a pneumonia shot.  Tetanus- Every ten years.  Zostavax- Once after the age of 60 to prevent shingles.  Take these steps  Don't smoke- Your healthcare provider can help you quit. For tips on how to quit, ask your healthcare provider or go to www.smokefree.gov or call 1-800 QUIT-NOW.  Be physically active- Exercise 5 days a week for a minimum of 30 minutes.  If you are not already physically active, start slow and gradually work up to 30 minutes of moderate physical activity.  Try walking, dancing, bike riding, swimming, etc.  Eat a healthy diet- Eat a variety of healthy foods such as fruits, vegetables, whole grains, low   fat milk, low fat cheeses, yogurt, lean meats, chicken, fish, eggs, dried beans, tofu, etc.  For more information go to www.thenutritionsource.org  Dental visit- Brush and floss teeth twice daily; visit your dentist twice a year.  Eye exam- Visit your Optometrist or Ophthalmologist yearly.  Drink alcohol in moderation- Limit alcohol intake to one drink or less a day.  Never drink and drive.  Depression- Your emotional  health is as important as your physical health.  If you're feeling down or losing interest in things you normally enjoy, please talk to your healthcare provider.  Seat Belts- can save your life; always wear one  Smoke/Carbon Monoxide detectors- These detectors need to be installed on the appropriate level of your home.  Replace batteries at least once a year.  Violence- If anyone is threatening or hurting you, please tell your healthcare provider.  Living Will/ Health care power of attorney- Discuss with your healthcare provider and family.  

## 2017-03-12 LAB — CMP14+EGFR
ALT: 17 IU/L (ref 0–32)
AST: 29 IU/L (ref 0–40)
Albumin/Globulin Ratio: 1.5 (ref 1.2–2.2)
Albumin: 4.6 g/dL (ref 3.5–5.5)
Alkaline Phosphatase: 87 IU/L (ref 39–117)
BILIRUBIN TOTAL: 0.5 mg/dL (ref 0.0–1.2)
BUN / CREAT RATIO: 8 — AB (ref 9–23)
BUN: 9 mg/dL (ref 6–24)
CHLORIDE: 106 mmol/L (ref 96–106)
CO2: 25 mmol/L (ref 20–29)
Calcium: 9.7 mg/dL (ref 8.7–10.2)
Creatinine, Ser: 1.07 mg/dL — ABNORMAL HIGH (ref 0.57–1.00)
GFR calc non Af Amer: 60 mL/min/{1.73_m2} (ref >59)
GFR, EST AFRICAN AMERICAN: 69 mL/min/{1.73_m2} (ref >59)
GLOBULIN, TOTAL: 3.1 g/dL (ref 1.5–4.5)
Glucose: 90 mg/dL (ref 65–99)
POTASSIUM: 4.6 mmol/L (ref 3.5–5.2)
Sodium: 145 mmol/L — ABNORMAL HIGH (ref 134–144)
TOTAL PROTEIN: 7.7 g/dL (ref 6.0–8.5)

## 2017-03-12 LAB — CBC
Hematocrit: 40.3 % (ref 34.0–46.6)
Hemoglobin: 12.7 g/dL (ref 11.1–15.9)
MCH: 27.9 pg (ref 26.6–33.0)
MCHC: 31.5 g/dL (ref 31.5–35.7)
MCV: 89 fL (ref 79–97)
PLATELETS: 180 10*3/uL (ref 150–379)
RBC: 4.55 x10E6/uL (ref 3.77–5.28)
RDW: 13.4 % (ref 12.3–15.4)
WBC: 3.8 10*3/uL (ref 3.4–10.8)

## 2017-03-12 LAB — LIPID PANEL
CHOL/HDL RATIO: 2.2 ratio (ref 0.0–4.4)
Cholesterol, Total: 203 mg/dL — ABNORMAL HIGH (ref 100–199)
HDL: 91 mg/dL (ref >39)
LDL Calculated: 103 mg/dL — ABNORMAL HIGH (ref 0–99)
Triglycerides: 46 mg/dL (ref 0–149)
VLDL Cholesterol Cal: 9 mg/dL (ref 5–40)

## 2017-03-12 LAB — TSH: TSH: 0.998 u[IU]/mL (ref 0.450–4.500)

## 2017-03-18 LAB — PAP IG W/ RFLX HPV ASCU: PAP SMEAR COMMENT: 0

## 2017-03-18 LAB — HPV DNA PROBE HIGH RISK, AMPLIFIED: HPV, HIGH-RISK: NEGATIVE

## 2017-05-06 ENCOUNTER — Other Ambulatory Visit: Payer: Self-pay | Admitting: Emergency Medicine

## 2017-05-06 DIAGNOSIS — Z1231 Encounter for screening mammogram for malignant neoplasm of breast: Secondary | ICD-10-CM

## 2017-06-02 ENCOUNTER — Ambulatory Visit
Admission: RE | Admit: 2017-06-02 | Discharge: 2017-06-02 | Disposition: A | Discharge status: Home / Self Care | Payer: BC Managed Care – PPO | Source: Ambulatory Visit | Attending: Emergency Medicine | Admitting: Emergency Medicine

## 2017-06-02 DIAGNOSIS — Z1231 Encounter for screening mammogram for malignant neoplasm of breast: Secondary | ICD-10-CM

## 2017-10-28 ENCOUNTER — Encounter: Payer: Self-pay | Admitting: Physician Assistant

## 2017-10-28 ENCOUNTER — Other Ambulatory Visit: Payer: Self-pay

## 2017-10-28 ENCOUNTER — Ambulatory Visit: Payer: BC Managed Care – PPO | Admitting: Physician Assistant

## 2017-10-28 VITALS — BP 124/78 | HR 106 | Temp 98.2°F | Resp 10 | Ht 64.57 in | Wt 154.8 lb

## 2017-10-28 DIAGNOSIS — R0981 Nasal congestion: Secondary | ICD-10-CM

## 2017-10-28 MED ORDER — DOXYCYCLINE HYCLATE 100 MG PO CAPS: 100.0000 mg | ORAL_CAPSULE | Freq: Two times a day (BID) | ORAL | 0 refills | Status: AC

## 2017-10-28 MED ORDER — FLUTICASONE FUROATE 27.5 MCG/SPRAY NA SUSP: 2.0000 | Freq: Every day | NASAL | 12 refills | Status: DC

## 2017-10-28 NOTE — Patient Instructions (Addendum)
Continue the Claritin D for now.  You can try cough drops for moisture in the mouth.  Start the [facility] today.  If you improve in the next 3 days with the Flonase alone then stop the Claritin-D and continue with the Flonase until allergy season subsides.  If nothing is working and please fill the Doxycycline to treat for a bacterial cause of your symptoms.    IF you received an x-ray today, you will receive an invoice from [facility]. Please contact [facility] at [phone] with questions or concerns regarding your invoice.   IF you received labwork today, you will receive an invoice from [facility]. Please contact [facility] at [phone] with questions or concerns regarding your invoice.   Our billing staff will not be able to assist you with questions regarding bills from these companies.  You will be contacted with the lab results as soon as they are available. The fastest way to get your results is to activate your My Chart account. Instructions are located on the last page of this paperwork. If you have not heard from [place] regarding the results in 2 weeks, please contact this office.

## 2017-10-28 NOTE — Progress Notes (Signed)
10/28/2017 4:32 PM   DOB: 1965/09/18 / MRN: [id number]  SUBJECTIVE:  Lacey Herrera is a 52 y.o. female presenting for nasal congestion times 10 days.  Patient has been taking Claritin-D with some relief however still has nasal congestion and bilateral maxillary sinus pressure.  Patient denies fever, teeth pain, headache.  She feels she is getting better albeit slowly.  She has No Known Allergies.   She  has a past medical history of Ulcer.    She  reports that she has never smoked. She has never used smokeless tobacco. She reports that she drinks alcohol. She reports that she does not use drugs. She  reports that she currently engages in sexual activity. The patient  has a past surgical history that includes Tubal ligation (93).  Her family history includes Colon cancer in her maternal grandmother; Hypercholesterolemia in her father; Hypertension in her mother.  Review of Systems  Constitutional: Negative for chills, diaphoresis and fever.  Eyes: Negative.   Respiratory: Negative for shortness of breath.   Cardiovascular: Negative for chest pain, orthopnea and leg swelling.  Gastrointestinal: Negative for nausea.  Skin: Negative for rash.  Neurological: Negative for dizziness, sensory change, speech change, focal weakness and headaches.    The problem list and medications were reviewed and updated by myself where necessary and exist elsewhere in the encounter.   OBJECTIVE:  BP 124/78   Pulse (!) 106   Temp 98.2 F (36.8 C)   Resp 10   Ht 5' 4.57" (1.64 m)   Wt 154 lb 12.8 oz (70.2 kg)   SpO2 98%   BMI 26.11 kg/m   Physical Exam  Constitutional: She is active.  Non-toxic appearance.  HENT:  Right Ear: Hearing, tympanic membrane, external ear and ear canal normal.  Left Ear: Hearing, tympanic membrane, external ear and ear canal normal.  Nose: Nose normal. Right sinus exhibits no maxillary sinus tenderness and no frontal sinus tenderness. Left sinus exhibits no maxillary  sinus tenderness and no frontal sinus tenderness.  Mouth/Throat: Uvula is midline, oropharynx is clear and moist and mucous membranes are normal. Mucous membranes are not dry. No oropharyngeal exudate, posterior oropharyngeal edema or tonsillar abscesses.  Cardiovascular: Normal rate, regular rhythm, S1 normal, S2 normal, normal heart sounds and intact distal pulses. Exam reveals no gallop, no friction rub and no decreased pulses.  No murmur heard. Pulmonary/Chest: Effort normal. No stridor. No tachypnea. No respiratory distress. She has no wheezes. She has no rales.  Abdominal: She exhibits no distension.  Musculoskeletal: She exhibits no edema.  Lymphadenopathy:       Head (right side): No submandibular and no tonsillar adenopathy present.       Head (left side): No submandibular and no tonsillar adenopathy present.    She has no cervical adenopathy.  Neurological: She is alert.  Skin: Skin is warm and dry. She is not diaphoretic. No pallor.    No results found for this or any previous visit (from the past 72 hour(s)).  No results found.  ASSESSMENT AND PLAN:  Lacey Herrera was seen today for sinus congestion.  Diagnoses and all orders for this visit:  Nasal congestion: 10 days of symptoms.  Is not unreasonable to try an antibiotic however I think the overall clinical picture points towards allergic rhinitis.  I have advised that she start the Flonase and if she begins having relief she can stop the Claritin D.  Fill antibiotic if still not improving on day 13 or so of  illness. -     fluticasone (FLONASE SENSIMIST) 27.5 MCG/SPRAY nasal spray; Place 2 sprays into the nose daily. -     doxycycline (VIBRAMYCIN) 100 MG capsule; Take 1 capsule (100 mg total) by mouth 2 (two) times daily for 10 days.    The patient is advised to call or return to clinic if she does not see an improvement in symptoms, or to seek the care of the closest emergency department if she worsens with the above plan.    Lacey Herrera, MHS, PA-C [facility] 10/28/2017 4:32 PM

## 2018-01-04 HISTORY — PX: KIDNEY DONATION: SHX685

## 2018-01-05 DIAGNOSIS — Z905 Acquired absence of kidney: Secondary | ICD-10-CM | POA: Insufficient documentation

## 2018-01-10 DIAGNOSIS — Z524 Kidney donor: Secondary | ICD-10-CM | POA: Insufficient documentation

## 2018-03-15 ENCOUNTER — Encounter: Payer: BC Managed Care – PPO | Admitting: Physician Assistant

## 2018-03-17 ENCOUNTER — Ambulatory Visit (INDEPENDENT_AMBULATORY_CARE_PROVIDER_SITE_OTHER): Payer: BC Managed Care – PPO | Admitting: Physician Assistant

## 2018-03-17 ENCOUNTER — Other Ambulatory Visit: Payer: Self-pay

## 2018-03-17 ENCOUNTER — Encounter: Payer: Self-pay | Admitting: Physician Assistant

## 2018-03-17 VITALS — BP 116/76 | HR 86 | Temp 98.4°F | Resp 20 | Ht 65.28 in | Wt 145.6 lb

## 2018-03-17 DIAGNOSIS — Z0000 Encounter for general adult medical examination without abnormal findings: Secondary | ICD-10-CM | POA: Diagnosis not present

## 2018-03-17 DIAGNOSIS — Z13228 Encounter for screening for other metabolic disorders: Secondary | ICD-10-CM

## 2018-03-17 DIAGNOSIS — Z1389 Encounter for screening for other disorder: Secondary | ICD-10-CM

## 2018-03-17 DIAGNOSIS — Z13220 Encounter for screening for lipoid disorders: Secondary | ICD-10-CM | POA: Diagnosis not present

## 2018-03-17 DIAGNOSIS — Z23 Encounter for immunization: Secondary | ICD-10-CM | POA: Diagnosis not present

## 2018-03-17 DIAGNOSIS — Z130 Encounter for screening for diseases of the blood and blood-forming organs and certain disorders involving the immune mechanism: Secondary | ICD-10-CM

## 2018-03-17 MED ORDER — ZOSTER VAC RECOMB ADJUVANTED 50 MCG/0.5ML IM SUSR: 0.5000 mL | Freq: Once | INTRAMUSCULAR | 0 refills | Status: AC

## 2018-03-17 NOTE — Patient Instructions (Addendum)
It was a pleasure meeting you today. Please contact Dr. Lorie Apley office to schedule colonoscopy in October.   If you have lab work done today you will be contacted with your lab results within the next 2 weeks.  If you have not heard from [place] then please contact us. The fastest way to get your results is to register for My Chart. Health Maintenance, Female Adopting a healthy lifestyle and getting preventive care can go a long way to promote health and wellness. Talk with your health care provider about what schedule of regular examinations is right for you. This is a good chance for you to check in with your provider about disease prevention and staying healthy. In between checkups, there are plenty of things you can do on your own. Experts have done a lot of research about which lifestyle changes and preventive measures are most likely to keep you healthy. Ask your health care provider for more information. Weight and diet Eat a healthy diet  Be sure to include plenty of vegetables, fruits, low-fat dairy products, and lean protein.  Do not eat a lot of foods high in solid fats, added sugars, or salt.  Get regular exercise. This is one of the most important things you can do for your health. ? Most adults should exercise for at least 150 minutes each week. The exercise should increase your heart rate and make you sweat (moderate-intensity exercise). ? Most adults should also do strengthening exercises at least twice a week. This is in addition to the moderate-intensity exercise.  Maintain a healthy weight  Body mass index (BMI) is a measurement that can be used to identify possible weight problems. It estimates body fat based on height and weight. Your health care provider can help determine your BMI and help you achieve or maintain a healthy weight.  For females 20 years of age and older: ? A BMI below 18.5 is considered underweight. ? A BMI of 18.5 to 24.9 is normal. ? A BMI of 25 to 29.9 is  considered overweight. ? A BMI of 30 and above is considered obese.  Watch levels of cholesterol and blood lipids  You should start having your blood tested for lipids and cholesterol at 54 years of age, then have this test every 5 years.  You may need to have your cholesterol levels checked more often if: ? Your lipid or cholesterol levels are high. ? You are older than 54 years of age. ? You are at high risk for heart disease.  Cancer screening Lung Cancer  Lung cancer screening is recommended for adults 54-27 years old who are at high risk for lung cancer because of a history of smoking.  A yearly low-dose CT scan of the lungs is recommended for people who: ? Currently smoke. ? Have quit within the past 15 years. ? Have at least a 30-pack-year history of smoking. A pack year is smoking an average of one pack of cigarettes a day for 1 year.  Yearly screening should continue until it has been 15 years since you quit.  Yearly screening should stop if you develop a health problem that would prevent you from having lung cancer treatment.  Breast Cancer  Practice breast self-awareness. This means understanding how your breasts normally appear and feel.  It also means doing regular breast self-exams. Let your health care provider know about any changes, no matter how small.  If you are in your 20s or 30s, you should have a clinical breast  exam (CBE) by a health care provider every 1-3 years as part of a regular health exam.  If you are 45 or older, have a CBE every year. Also consider having a breast X-ray (mammogram) every year.  If you have a family history of breast cancer, talk to your health care provider about genetic screening.  If you are at high risk for breast cancer, talk to your health care provider about having an MRI and a mammogram every year.  Breast cancer gene (BRCA) assessment is recommended for women who have family members with BRCA-related cancers.  BRCA-related cancers include: ? Breast. ? Ovarian. ? Tubal. ? Peritoneal cancers.  Results of the assessment will determine the need for genetic counseling and BRCA1 and BRCA2 testing.  Cervical Cancer Your health care provider may recommend that you be screened regularly for cancer of the pelvic organs (ovaries, uterus, and vagina). This screening involves a pelvic examination, including checking for microscopic changes to the surface of your cervix (Pap test). You may be encouraged to have this screening done every 3 years, beginning at age 32.  For women ages 26-65, health care providers may recommend pelvic exams and Pap testing every 3 years, or they may recommend the Pap and pelvic exam, combined with testing for human papilloma virus (HPV), every 5 years. Some types of HPV increase your risk of cervical cancer. Testing for HPV may also be done on women of any age with unclear Pap test results.  Other health care providers may not recommend any screening for nonpregnant women who are considered low risk for pelvic cancer and who do not have symptoms. Ask your health care provider if a screening pelvic exam is right for you.  If you have had past treatment for cervical cancer or a condition that could lead to cancer, you need Pap tests and screening for cancer for at least 20 years after your treatment. If Pap tests have been discontinued, your risk factors (such as having a new sexual partner) need to be reassessed to determine if screening should resume. Some women have medical problems that increase the chance of getting cervical cancer. In these cases, your health care provider may recommend more frequent screening and Pap tests.  Colorectal Cancer  This type of cancer can be detected and often prevented.  Routine colorectal cancer screening usually begins at 54 years of age and continues through 54 years of age.  Your health care provider may recommend screening at an earlier age if  you have risk factors for colon cancer.  Your health care provider may also recommend using home test kits to check for hidden blood in the stool.  A small camera at the end of a tube can be used to examine your colon directly (sigmoidoscopy or colonoscopy). This is done to check for the earliest forms of colorectal cancer.  Routine screening usually begins at age 88.  Direct examination of the colon should be repeated every 5-10 years through 54 years of age. However, you may need to be screened more often if early forms of precancerous polyps or small growths are found.  Skin Cancer  Check your skin from head to toe regularly.  Tell your health care provider about any new moles or changes in moles, especially if there is a change in a mole's shape or color.  Also tell your health care provider if you have a mole that is larger than the size of a pencil eraser.  Always use sunscreen. Apply sunscreen  liberally and repeatedly throughout the day.  Protect yourself by wearing long sleeves, pants, a wide-brimmed hat, and sunglasses whenever you are outside.  Heart disease, diabetes, and high blood pressure  High blood pressure causes heart disease and increases the risk of stroke. High blood pressure is more likely to develop in: ? People who have blood pressure in the high end of the normal range (130-139/85-89 mm Hg). ? People who are overweight or obese. ? People who are African American.  If you are 48-58 years of age, have your blood pressure checked every 3-5 years. If you are 20 years of age or older, have your blood pressure checked every year. You should have your blood pressure measured twice-once when you are at a hospital or clinic, and once when you are not at a hospital or clinic. Record the average of the two measurements. To check your blood pressure when you are not at a hospital or clinic, you can use: ? An automated blood pressure machine at a pharmacy. ? A home blood  pressure monitor.  If you are between 18 years and 76 years old, ask your health care provider if you should take aspirin to prevent strokes.  Have regular diabetes screenings. This involves taking a blood sample to check your fasting blood sugar level. ? If you are at a normal weight and have a low risk for diabetes, have this test once every three years after 54 years of age. ? If you are overweight and have a high risk for diabetes, consider being tested at a younger age or more often. Preventing infection Hepatitis B  If you have a higher risk for hepatitis B, you should be screened for this virus. You are considered at high risk for hepatitis B if: ? You were born in a country where hepatitis B is common. Ask your health care provider which countries are considered high risk. ? Your parents were born in a high-risk country, and you have not been immunized against hepatitis B (hepatitis B vaccine). ? You have HIV or AIDS. ? You use needles to inject street drugs. ? You live with someone who has hepatitis B. ? You have had sex with someone who has hepatitis B. ? You get hemodialysis treatment. ? You take certain medicines for conditions, including cancer, organ transplantation, and autoimmune conditions.  Hepatitis C  Blood testing is recommended for: ? Everyone born from [birth year] through 1967. ? Anyone with known risk factors for hepatitis C.  Sexually transmitted infections (STIs)  You should be screened for sexually transmitted infections (STIs) including gonorrhea and chlamydia if: ? You are sexually active and are younger than 54 years of age. ? You are older than 54 years of age and your health care provider tells you that you are at risk for this type of infection. ? Your sexual activity has changed since you were last screened and you are at an increased risk for chlamydia or gonorrhea. Ask your health care provider if you are at risk.  If you do not have HIV, but are at risk,  it may be recommended that you take a prescription medicine daily to prevent HIV infection. This is called pre-exposure prophylaxis (PrEP). You are considered at risk if: ? You are sexually active and do not regularly use condoms or know the HIV status of your partner(s). ? You take drugs by injection. ? You are sexually active with a partner who has HIV.  Talk with your health care provider about  whether you are at high risk of being infected with HIV. If you choose to begin PrEP, you should first be tested for HIV. You should then be tested every 3 months for as long as you are taking PrEP. Pregnancy  If you are premenopausal and you may become pregnant, ask your health care provider about preconception counseling.  If you may become pregnant, take 400 to 800 micrograms (mcg) of folic acid every day.  If you want to prevent pregnancy, talk to your health care provider about birth control (contraception). Osteoporosis and menopause  Osteoporosis is a disease in which the bones lose minerals and strength with aging. This can result in serious bone fractures. Your risk for osteoporosis can be identified using a bone density scan.  If you are 91 years of age or older, or if you are at risk for osteoporosis and fractures, ask your health care provider if you should be screened.  Ask your health care provider whether you should take a calcium or vitamin D supplement to lower your risk for osteoporosis.  Menopause may have certain physical symptoms and risks.  Hormone replacement therapy may reduce some of these symptoms and risks. Talk to your health care provider about whether hormone replacement therapy is right for you. Follow these instructions at home:  Schedule regular health, dental, and eye exams.  Stay current with your immunizations.  Do not use any tobacco products including cigarettes, chewing tobacco, or electronic cigarettes.  If you are pregnant, do not drink  alcohol.  If you are breastfeeding, limit how much and how often you drink alcohol.  Limit alcohol intake to no more than 1 drink per day for nonpregnant women. One drink equals 12 ounces of beer, 5 ounces of wine, or 1 ounces of hard liquor.  Do not use street drugs.  Do not share needles.  Ask your health care provider for help if you need support or information about quitting drugs.  Tell your health care provider if you often feel depressed.  Tell your health care provider if you have ever been abused or do not feel safe at home. This information is not intended to replace advice given to you by your health care provider. Make sure you discuss any questions you have with your health care provider. Document Released: 01/20/2011 Document Revised: 12/13/2015 Document Reviewed: 04/10/2015 Elsevier Interactive Patient Education  2018 [employer].   IF you received an x-ray today, you will receive an invoice from [facility]. Please contact [facility] at [phone] with questions or concerns regarding your invoice.   IF you received labwork today, you will receive an invoice from [facility]. Please contact [facility] at [phone] with questions or concerns regarding your invoice.   Our billing staff will not be able to assist you with questions regarding bills from these companies.  You will be contacted with the lab results as soon as they are available. The fastest way to get your results is to activate your My Chart account. Instructions are located on the last page of this paperwork. If you have not heard from [place] regarding the results in 2 weeks, please contact this office.

## 2018-03-17 NOTE — Progress Notes (Addendum)
Lacey Herrera  MRN: [id number] DOB: 1966-03-16  Subjective:  Pt is a 52 y.o. female who presents for annual physical exam. Pt is fasting today.    Diet: Watches what she eats. Eats a healthy diet. Good balance of meat, veggies, fruits, and dairy. Drinks mostly water. Takes daily multivitamin.  Exercise: Walks 2 miles daily Sleep:7-8 hours  Menses: Postmenopausal.  BM: Daily  Last dental exam: Just recently, brushes BID Last vision exam: 2019 Last pap smear: 2018, +ASCUS -HPV Last mammogram: 2018, normal Last colonoscopy: 05/20/2013, recommend repeat 5 years Vaccinations      Tetanus: 2015      Influenza: 2018      Zostavax: Never   Patient Active Problem List   Diagnosis Date Noted  . 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 Helicobacter positive gastritis 03/03/2014  . Acute blood loss anemia 03/03/2014    Current Outpatient Medications on File Prior to Visit  Medication Sig Dispense Refill  . fluticasone (FLONASE SENSIMIST) 27.5 MCG/SPRAY nasal spray Place 2 sprays into the nose daily. 10 g 12  . OVER THE COUNTER MEDICATION Fibergummies taking 2 a day    . OVER THE COUNTER MEDICATION 5 Alive gummies taking 2 a day    . OVER THE COUNTER MEDICATION OTC Iron tablets taking 2 a day     No current facility-administered medications on file prior to visit.     No Known Allergies  Social History   Socioeconomic History  . Marital status: Married    Spouse name: Not on file  . Number of children: Not on file  . Years of education: Not on file  . Highest education level: Not on file  Occupational History  . Not on file  Social Needs  . Financial resource strain: Not on file  . Food insecurity:    Worry: Not on file    Inability: Not on file  . Transportation needs:    Medical: Not on file    Non-medical: Not on file  Tobacco Use  . Smoking status: Never Smoker  . Smokeless tobacco: Never Used  Substance and Sexual Activity    . Alcohol use: Yes    Alcohol/week: 0.0 standard drinks    Comment: 4 drinks  . Drug use: No  . Sexual activity: Yes  Lifestyle  . Physical activity:    Days per week: Not on file    Minutes per session: Not on file  . Stress: Not on file  Relationships  . Social connections:    Talks on phone: Not on file    Gets together: Not on file    Attends religious service: Not on file    Active member of club or organization: Not on file    Attends meetings of clubs or organizations: Not on file    Relationship status: Not on file  Other Topics Concern  . Not on file  Social History Narrative   Married. Exercise:Yes.    Past Surgical History:  Procedure Laterality Date  . 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 TUBAL LIGATION  89    Family History  Problem Relation Age of Onset  . Hypertension Mother   . Hypercholesterolemia Father   . Colon cancer Maternal Grandmother     Review of Systems  Constitutional: Negative for activity change, appetite change, chills, diaphoresis, fatigue, fever and unexpected weight change.  HENT: Negative for congestion, dental problem, drooling, ear discharge, ear pain, facial swelling, hearing loss,  mouth sores, nosebleeds, postnasal drip, rhinorrhea, sinus pressure, sinus pain, sneezing, sore throat, tinnitus, trouble swallowing and voice change.   Eyes: Negative for photophobia, pain, discharge, redness, itching and visual disturbance.  Respiratory: Negative for apnea, cough, choking, chest tightness, shortness of breath, wheezing and stridor.   Cardiovascular: Negative for chest pain, palpitations and leg swelling.  Gastrointestinal: Negative for abdominal distention, abdominal pain, anal bleeding, blood in stool, constipation, diarrhea, nausea, rectal pain and vomiting.  Endocrine: Negative for cold intolerance, heat intolerance, polydipsia, polyphagia and polyuria.  Genitourinary: Negative for decreased urine volume, difficulty urinating,  dyspareunia, dysuria, enuresis, flank pain, frequency, genital sores, hematuria, menstrual problem, pelvic pain, urgency, vaginal bleeding, vaginal discharge and vaginal pain.  Musculoskeletal: Negative for arthralgias, back pain, gait problem, joint swelling, myalgias, neck pain and neck stiffness.  Skin: Negative for color change, pallor, rash and wound.  Allergic/Immunologic: Negative for environmental allergies, food allergies and immunocompromised state.  Neurological: Negative for dizziness, tremors, seizures, syncope, facial asymmetry, speech difficulty, weakness, light-headedness, numbness and headaches.  Hematological: Negative for adenopathy. Does not bruise/bleed easily.  Psychiatric/Behavioral: Negative for agitation, behavioral problems, confusion, decreased concentration, dysphoric mood, hallucinations, self-injury, sleep disturbance and suicidal ideas. The patient is not nervous/anxious and is not hyperactive.     Objective:  BP 116/76   Pulse 86   Temp 98.4 F (36.9 C) (Oral)   Resp 20   Ht 5' 5.28" (1.658 m)   Wt 145 lb 9.6 oz (66 kg)   SpO2 99%   BMI 24.02 kg/m   Physical Exam  Constitutional: She is oriented to person, place, and time. She appears well-developed and well-nourished. No distress.  HENT:  Head: Normocephalic and atraumatic.  Right Ear: Hearing, tympanic membrane, external ear and ear canal normal.  Left Ear: Hearing, tympanic membrane, external ear and ear canal normal.  Nose: Nose normal.  Mouth/Throat: Uvula is midline, oropharynx is clear and moist and mucous membranes are normal. No oropharyngeal exudate.  Eyes: Pupils are equal, round, and reactive to light. Conjunctivae, EOM and lids are normal. No scleral icterus.  Neck: Trachea normal and normal range of motion. No thyroid mass and no thyromegaly present.  Cardiovascular: Normal rate, regular rhythm, normal heart sounds and intact distal pulses.  Pulmonary/Chest: Effort normal and breath sounds  normal. Right breast exhibits no inverted nipple, no mass, no nipple discharge, no skin change and no tenderness. Left breast exhibits no inverted nipple, no mass, no nipple discharge, no skin change and no tenderness.  Abdominal: Soft. Normal appearance and bowel sounds are normal. There is no tenderness.    Genitourinary: Vagina normal and uterus normal. Uterus is not deviated, not enlarged, not fixed and not tender. Cervix exhibits no motion tenderness. Right adnexum displays no mass, no tenderness and no fullness. Left adnexum displays no mass, no tenderness and no fullness.  Genitourinary Comments: CMA chaperone present for breast and GU exam.  Lymphadenopathy:       Head (right side): No tonsillar, no preauricular, no posterior auricular and no occipital adenopathy present.       Head (left side): No tonsillar, no preauricular, no posterior auricular and no occipital adenopathy present.    She has no cervical adenopathy.       Right: No supraclavicular adenopathy present.       Left: No supraclavicular adenopathy present.  Neurological: She is alert and oriented to person, place, and time. She has normal strength and normal reflexes.  Skin: Skin is warm and dry.  Visual Acuity Screening   Right eye Left eye Both eyes  Without correction:     With correction: _0    Assessment and Plan :  Discussed healthy lifestyle, diet, exercise, preventative care, vaccinations, and addressed patient's concerns. Plan for follow up in one year. Otherwise, plan for specific conditions below.  1. Annual physical exam Await lab results.  - Flu Vaccine QUAD 6+ mos PF IM (Fluarix Quad PF)  2. Screening for metabolic disorder - [id number]  3. Need for lipid screening - Lipid panel  4. Screening for deficiency anemia - CBC with Differential/Platelet  5. Screening for hematuria or proteinuria - Urinalysis, dipstick only   Tenna Delaine, PA-C  [facility] 03/17/2018 5:01 PM

## 2018-03-18 LAB — CBC WITH DIFFERENTIAL/PLATELET
BASOS ABS: 0 10*3/uL (ref 0.0–0.2)
BASOS: 0 %
EOS (ABSOLUTE): 0.1 10*3/uL (ref 0.0–0.4)
Eos: 1 %
Hematocrit: 37.1 % (ref 34.0–46.6)
Hemoglobin: 12.1 g/dL (ref 11.1–15.9)
IMMATURE GRANS (ABS): 0 10*3/uL (ref 0.0–0.1)
Immature Granulocytes: 0 %
LYMPHS: 43 %
Lymphocytes Absolute: 2.1 10*3/uL (ref 0.7–3.1)
MCH: 28.1 pg (ref 26.6–33.0)
MCHC: 32.6 g/dL (ref 31.5–35.7)
MCV: 86 fL (ref 79–97)
MONOS ABS: 0.3 10*3/uL (ref 0.1–0.9)
Monocytes: 5 %
NEUTROS PCT: 51 %
Neutrophils Absolute: 2.5 10*3/uL (ref 1.4–7.0)
Platelets: 163 10*3/uL (ref 150–450)
RBC: 4.3 x10E6/uL (ref 3.77–5.28)
RDW: 14.4 % (ref 12.3–15.4)
WBC: 4.9 10*3/uL (ref 3.4–10.8)

## 2018-03-18 LAB — CMP14+EGFR
A/G RATIO: 1.5 (ref 1.2–2.2)
ALT: 7 IU/L (ref 0–32)
AST: 19 IU/L (ref 0–40)
Albumin: 4.7 g/dL (ref 3.5–5.5)
Alkaline Phosphatase: 82 IU/L (ref 39–117)
BUN / CREAT RATIO: 8 — AB (ref 9–23)
BUN: 11 mg/dL (ref 6–24)
Bilirubin Total: 0.5 mg/dL (ref 0.0–1.2)
CALCIUM: 9.7 mg/dL (ref 8.7–10.2)
CO2: 22 mmol/L (ref 20–29)
Chloride: 102 mmol/L (ref 96–106)
Creatinine, Ser: 1.45 mg/dL — ABNORMAL HIGH (ref 0.57–1.00)
GFR calc non Af Amer: 41 mL/min/{1.73_m2} — ABNORMAL LOW (ref >59)
GFR, EST AFRICAN AMERICAN: 47 mL/min/{1.73_m2} — AB (ref >59)
GLUCOSE: 83 mg/dL (ref 65–99)
Globulin, Total: 3.2 g/dL (ref 1.5–4.5)
Potassium: 4.3 mmol/L (ref 3.5–5.2)
Sodium: 141 mmol/L (ref 134–144)
TOTAL PROTEIN: 7.9 g/dL (ref 6.0–8.5)

## 2018-03-18 LAB — URINALYSIS, DIPSTICK ONLY
BILIRUBIN UA: NEGATIVE
GLUCOSE, UA: NEGATIVE
KETONES UA: NEGATIVE
Nitrite, UA: NEGATIVE
PROTEIN UA: NEGATIVE
RBC UA: NEGATIVE
SPEC GRAV UA: 1.012 (ref 1.005–1.030)
Urobilinogen, Ur: 0.2 mg/dL (ref 0.2–1.0)
pH, UA: 5.5 (ref 5.0–7.5)

## 2018-03-18 LAB — LIPID PANEL
CHOL/HDL RATIO: 2.2 ratio (ref 0.0–4.4)
Cholesterol, Total: 202 mg/dL — ABNORMAL HIGH (ref 100–199)
HDL: 93 mg/dL (ref >39)
LDL CALC: 98 mg/dL (ref 0–99)
Triglycerides: 56 mg/dL (ref 0–149)
VLDL CHOLESTEROL CAL: 11 mg/dL (ref 5–40)

## 2018-04-29 ENCOUNTER — Other Ambulatory Visit (HOSPITAL_COMMUNITY): Payer: Self-pay | Admitting: Emergency Medicine

## 2018-04-29 DIAGNOSIS — Z1231 Encounter for screening mammogram for malignant neoplasm of breast: Secondary | ICD-10-CM

## 2018-05-05 IMAGING — MG 2D DIGITAL SCREENING BILATERAL MAMMOGRAM WITH CAD AND ADJUNCT TO
9 of 13 series · 9 of 29 positions shown · non-contrast
Comparison: Previous exam(s).

CLINICAL DATA: Screening.

EXAM:
2D DIGITAL SCREENING BILATERAL MAMMOGRAM WITH CAD AND ADJUNCT TOMO

[R CC (1 of 2)]
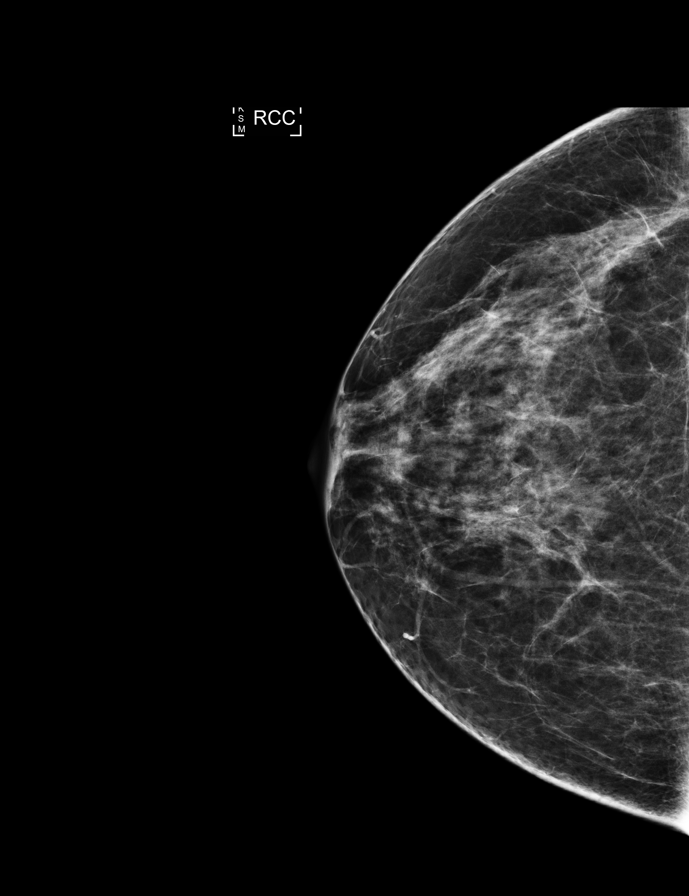

[R CC synth-2D]
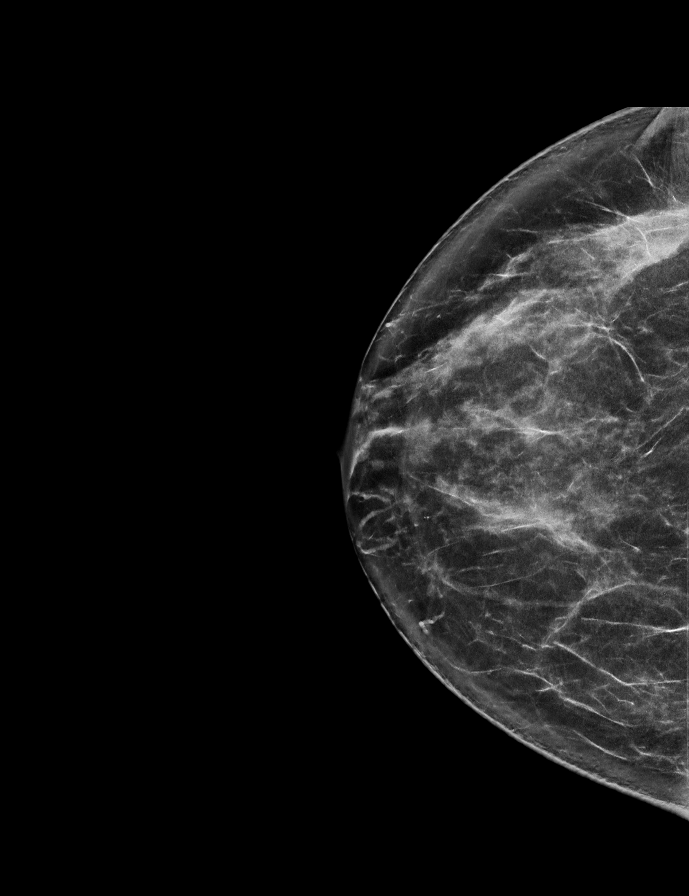

[L CC synth-2D]
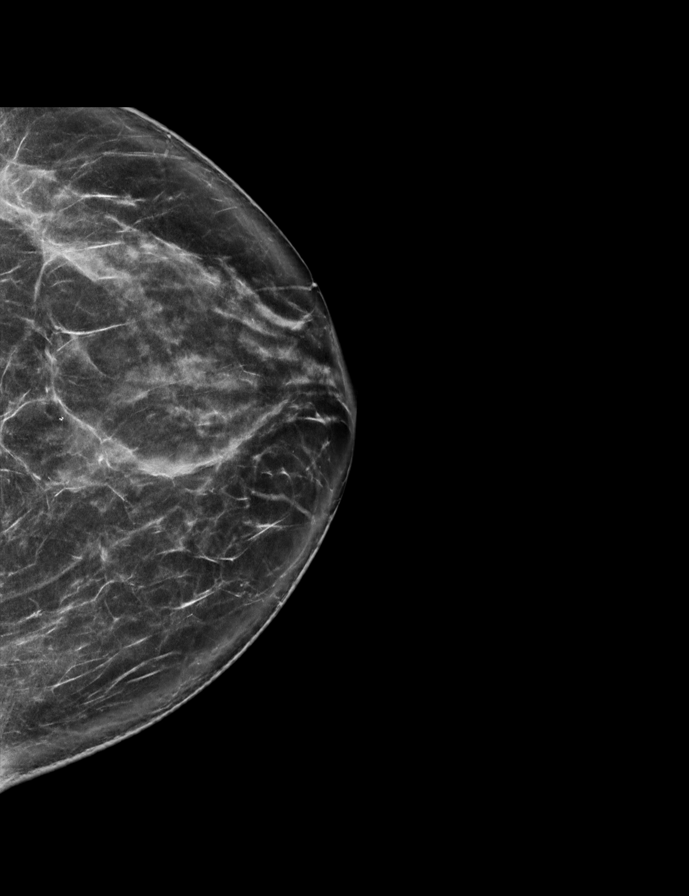

[R MLO]
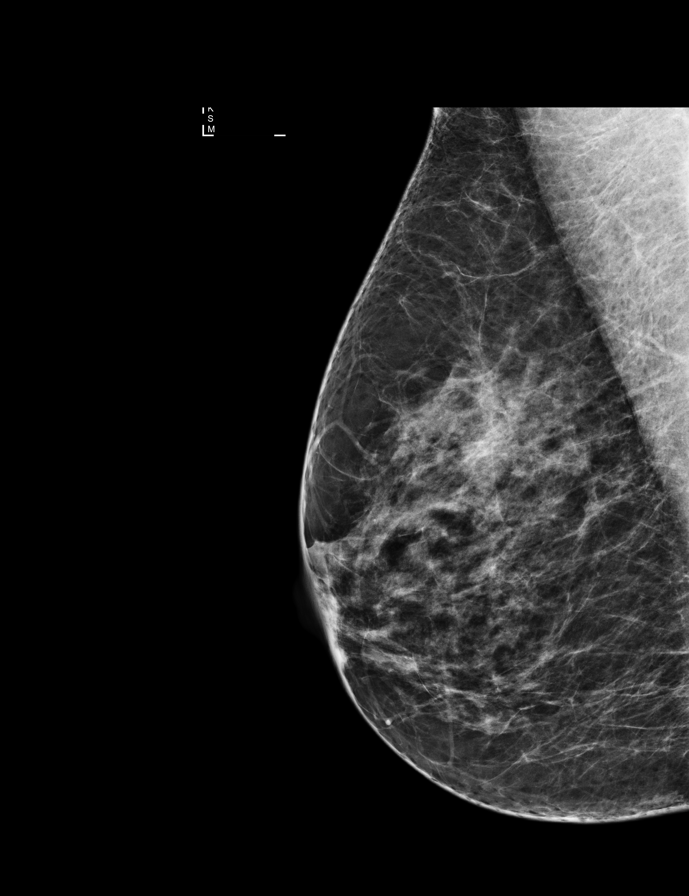

[L CC]
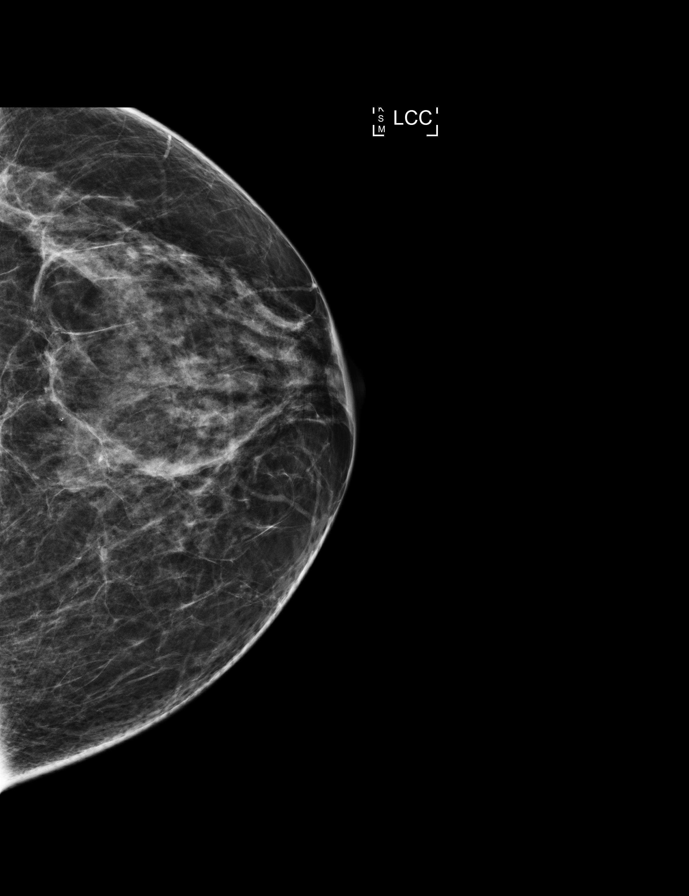

[R MLO synth-2D]
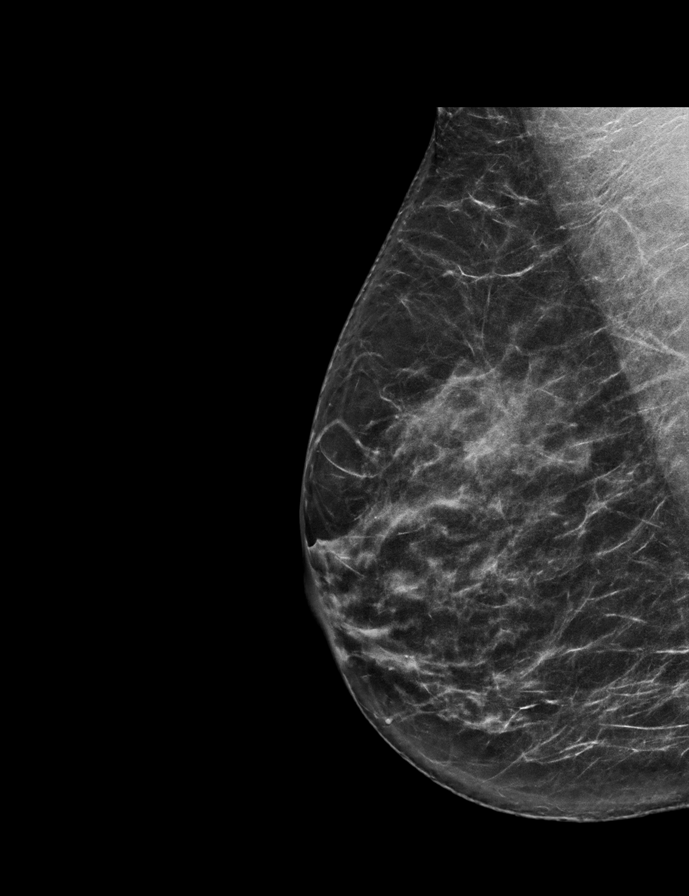

[R CC (2 of 2)]
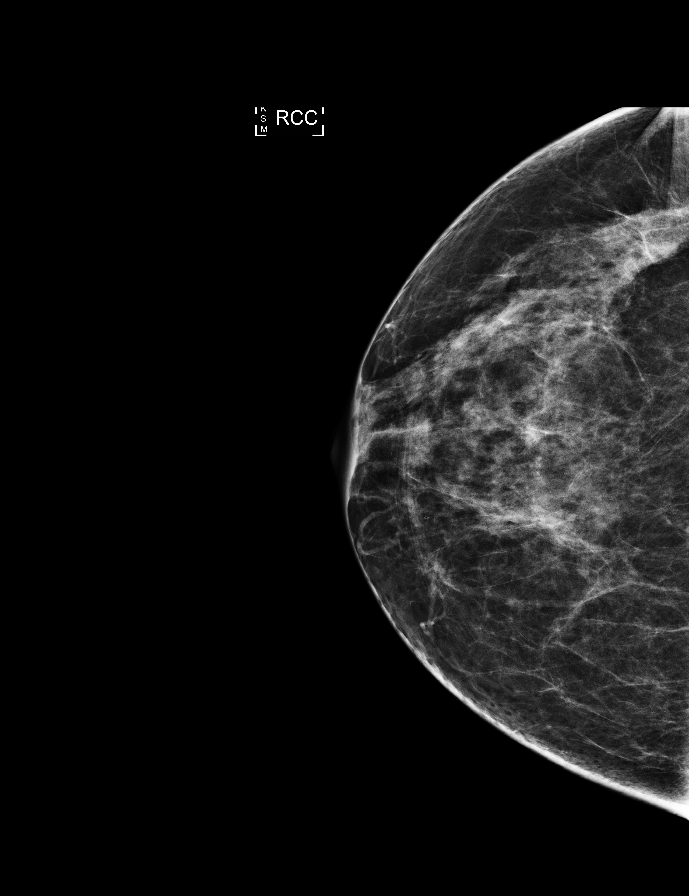

[L MLO]
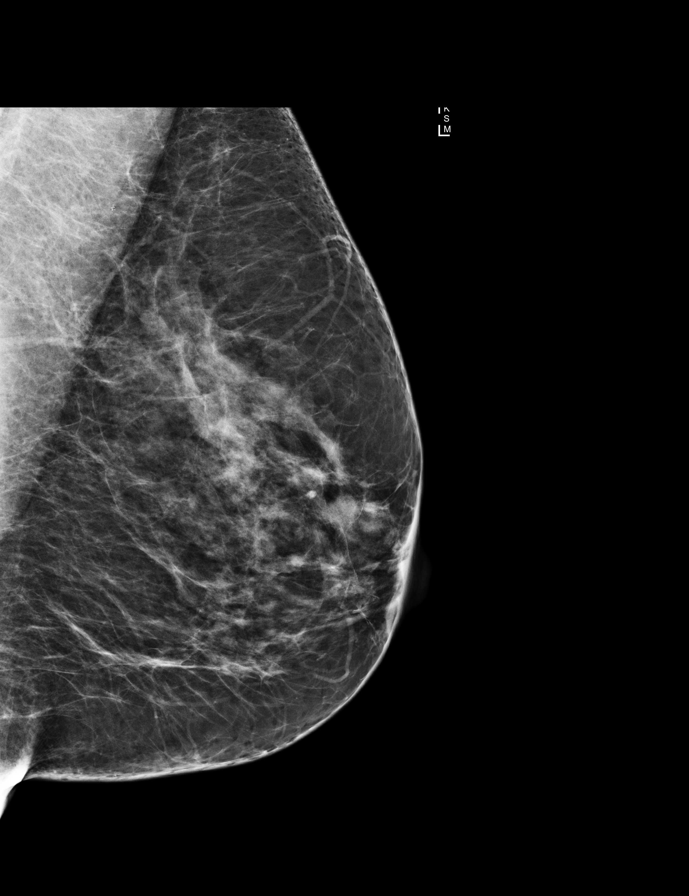

[L MLO synth-2D]
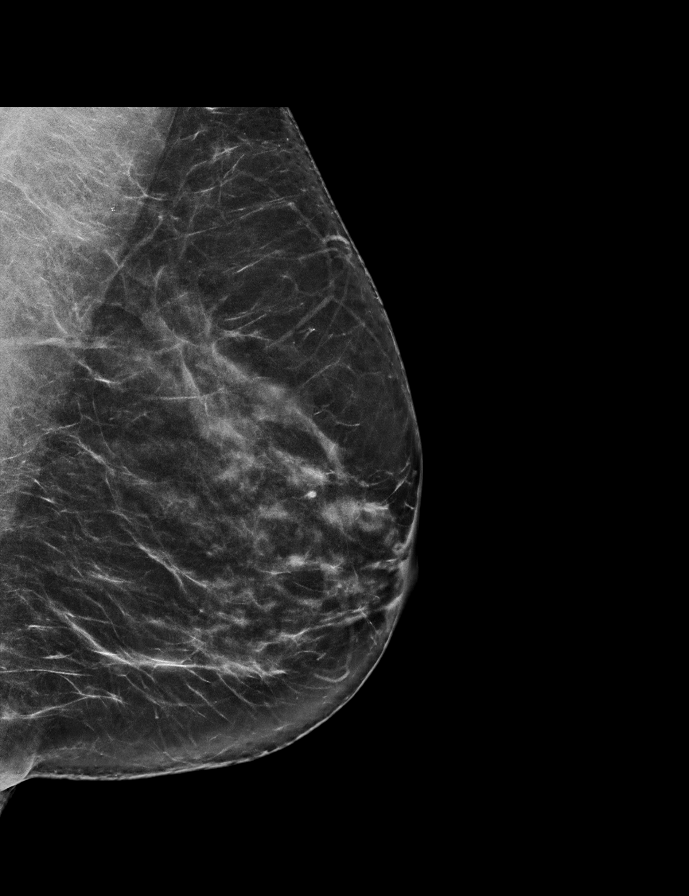

[9 of 29 positions shown; findings below may reference images not displayed]

ACR Breast Density Category c: The breast tissue is heterogeneously
dense, which may obscure small masses.
FINDINGS: There are no findings suspicious for malignancy. Images were
processed with CAD.
IMPRESSION: No mammographic evidence of malignancy. A result letter of this
screening mammogram will be mailed directly to the patient.

RECOMMENDATION:
Screening mammogram in one year. (Code:[id number])

BI-RADS CATEGORY  1: Negative.

## 2018-06-04 ENCOUNTER — Ambulatory Visit
Admission: RE | Admit: 2018-06-04 | Discharge: 2018-06-04 | Disposition: A | Discharge status: Home / Self Care | Payer: BC Managed Care – PPO | Source: Ambulatory Visit | Attending: Emergency Medicine | Admitting: Emergency Medicine

## 2018-06-04 DIAGNOSIS — Z1231 Encounter for screening mammogram for malignant neoplasm of breast: Secondary | ICD-10-CM

## 2019-04-13 ENCOUNTER — Encounter: Payer: Self-pay | Admitting: Gynecology

## 2019-05-03 ENCOUNTER — Encounter: Payer: Self-pay | Admitting: Family Medicine

## 2019-05-03 ENCOUNTER — Other Ambulatory Visit: Payer: Self-pay

## 2019-05-03 ENCOUNTER — Ambulatory Visit: Payer: BC Managed Care – PPO | Admitting: Family Medicine

## 2019-05-03 VITALS — BP 122/82 | HR 73 | Temp 98.1°F | Ht 65.5 in | Wt 147.0 lb

## 2019-05-03 DIAGNOSIS — Z524 Kidney donor: Secondary | ICD-10-CM | POA: Diagnosis not present

## 2019-05-03 DIAGNOSIS — Z130 Encounter for screening for diseases of the blood and blood-forming organs and certain disorders involving the immune mechanism: Secondary | ICD-10-CM

## 2019-05-03 DIAGNOSIS — Z1389 Encounter for screening for other disorder: Secondary | ICD-10-CM

## 2019-05-03 DIAGNOSIS — Z905 Acquired absence of kidney: Secondary | ICD-10-CM

## 2019-05-03 DIAGNOSIS — Z13220 Encounter for screening for lipoid disorders: Secondary | ICD-10-CM | POA: Diagnosis not present

## 2019-05-03 DIAGNOSIS — Z0001 Encounter for general adult medical examination with abnormal findings: Secondary | ICD-10-CM

## 2019-05-03 DIAGNOSIS — Z0000 Encounter for general adult medical examination without abnormal findings: Secondary | ICD-10-CM

## 2019-05-03 DIAGNOSIS — Z13228 Encounter for screening for other metabolic disorders: Secondary | ICD-10-CM

## 2019-05-03 DIAGNOSIS — Z1329 Encounter for screening for other suspected endocrine disorder: Secondary | ICD-10-CM

## 2019-05-03 NOTE — Patient Instructions (Addendum)
   If you have lab work done today you will be contacted with your lab results within the next 2 weeks.  If you have not heard from us then please contact us. The fastest way to get your results is to register for My Chart.   IF you received an x-ray today, you will receive an invoice from [facility]. Please contact [facility] at [phone] with questions or concerns regarding your invoice.   IF you received labwork today, you will receive an invoice from [facility]. Please contact [facility] at [phone] with questions or concerns regarding your invoice.   Our billing staff will not be able to assist you with questions regarding bills from these companies.  You will be contacted with the lab results as soon as they are available. The fastest way to get your results is to activate your My Chart account. Instructions are located on the last page of this paperwork. If you have not heard from us regarding the results in 2 weeks, please contact this office.     Preventive Care 55-64 Years Old, Female Preventive care refers to visits with your health care provider and lifestyle choices that can promote health and wellness. This includes:  A yearly physical exam. This may also be called an annual well check.  Regular dental visits and eye exams.  Immunizations.  Screening for certain conditions.  Healthy lifestyle choices, such as eating a healthy diet, getting regular exercise, not using drugs or products that contain nicotine and tobacco, and limiting alcohol use. What can I expect for my preventive care visit? Physical exam Your health care provider will check your:  Height and weight. This may be used to calculate body mass index (BMI), which tells if you are at a healthy weight.  Heart rate and blood pressure.  Skin for abnormal spots. Counseling Your health care provider may ask you questions about your:  Alcohol, tobacco, and drug use.  Emotional  well-being.  Home and relationship well-being.  Sexual activity.  Eating habits.  Work and work environment.  Method of birth control.  Menstrual cycle.  Pregnancy history. What immunizations do I need?  Influenza (flu) vaccine  This is recommended every year. Tetanus, diphtheria, and pertussis (Tdap) vaccine  You may need a Td booster every 10 years. Varicella (chickenpox) vaccine  You may need this if you have not been vaccinated. Zoster (shingles) vaccine  You may need this after age 55. Measles, mumps, and rubella (MMR) vaccine  You may need at least one dose of MMR if you were born in 1957 or later. You may also need a second dose. Pneumococcal conjugate (PCV13) vaccine  You may need this if you have certain conditions and were not previously vaccinated. Pneumococcal polysaccharide (PPSV23) vaccine  You may need one or two doses if you smoke cigarettes or if you have certain conditions. Meningococcal conjugate (MenACWY) vaccine  You may need this if you have certain conditions. Hepatitis A vaccine  You may need this if you have certain conditions or if you travel or work in places where you may be exposed to hepatitis A. Hepatitis B vaccine  You may need this if you have certain conditions or if you travel or work in places where you may be exposed to hepatitis B. Haemophilus influenzae type b (Hib) vaccine  You may need this if you have certain conditions. Human papillomavirus (HPV) vaccine  If recommended by your health care provider, you may need three doses over 6 months.   You may receive vaccines as individual doses or as more than one vaccine together in one shot (combination vaccines). Talk with your health care provider about the risks and benefits of combination vaccines. What tests do I need? Blood tests  Lipid and cholesterol levels. These may be checked every 5 years, or more frequently if you are over 50 years old.  Hepatitis C  test.  Hepatitis B test. Screening  Lung cancer screening. You may have this screening every year starting at age 55 if you have a 30-pack-year history of smoking and currently smoke or have quit within the past 15 years.  Colorectal cancer screening. All adults should have this screening starting at age 55 and continuing until age 75. Your health care provider may recommend screening at age 45 if you are at increased risk. You will have tests every 1-10 years, depending on your results and the type of screening test.  Diabetes screening. This is done by checking your blood sugar (glucose) after you have not eaten for a while (fasting). You may have this done every 1-3 years.  Mammogram. This may be done every 1-2 years. Talk with your health care provider about when you should start having regular mammograms. This may depend on whether you have a family history of breast cancer.  BRCA-related cancer screening. This may be done if you have a family history of breast, ovarian, tubal, or peritoneal cancers.  Pelvic exam and Pap test. This may be done every 3 years starting at age 21. Starting at age 30, this may be done every 5 years if you have a Pap test in combination with an HPV test. Other tests  Sexually transmitted disease (STD) testing.  Bone density scan. This is done to screen for osteoporosis. You may have this scan if you are at high risk for osteoporosis. Follow these instructions at home: Eating and drinking  Eat a diet that includes fresh fruits and vegetables, whole grains, lean protein, and low-fat dairy.  Take vitamin and mineral supplements as recommended by your health care provider.  Do not drink alcohol if: ? Your health care provider tells you not to drink. ? You are pregnant, may be pregnant, or are planning to become pregnant.  If you drink alcohol: ? Limit how much you have to 0-1 drink a day. ? Be aware of how much alcohol is in your drink. In the [place] one  drink equals one 12 oz bottle of beer (355 mL), one 5 oz glass of wine (148 mL), or one 1 oz glass of hard liquor (44 mL). Lifestyle  Take daily care of your teeth and gums.  Stay active. Exercise for at least 30 minutes on 5 or more days each week.  Do not use any products that contain nicotine or tobacco, such as cigarettes, e-cigarettes, and chewing tobacco. If you need help quitting, ask your health care provider.  If you are sexually active, practice safe sex. Use a condom or other form of birth control (contraception) in order to prevent pregnancy and STIs (sexually transmitted infections).  If told by your health care provider, take low-dose aspirin daily starting at age 55. What's next?  Visit your health care provider once a year for a well check visit.  Ask your health care provider how often you should have your eyes and teeth checked.  Stay up to date on all vaccines. This information is not intended to replace advice given to you by your health care provider. Make sure   you discuss any questions you have with your health care provider. Document Released: 08/03/2015 Document Revised: 03/18/2018 Document Reviewed: 03/18/2018 Elsevier Patient Education  2020 Elsevier Inc.  

## 2019-05-03 NOTE — Progress Notes (Signed)
10/13/20203:52 PM  Lacey Herrera 10/23/1965, 53 y.o., female [id number]  Chief Complaint  Patient presents with  . Annual Exam    past kidney donor for husband, wants to chk levels    HPI:   Patient is a 53 y.o. female with past medical history significant for kidney donor who presents today for CPE  Last CPE Aug 2019 Cervical Cancer Screening: 2018, pos ASCUS neg HPV, repeat pap/hpv in 2021 Breast Cancer Screening: 2019 Colorectal Cancer Screening: 2014, repeat in 5 years, has been evaluated by Dr Collene Mares this year, extended to 5 years more Bone Density Testing: at age 41 Seasonal Influenza Vaccination: annually Td/Tdap Vaccination: 2015 Pneumococcal Vaccination: at age 58 Zoster Vaccination: has [facility] 2 doses [facility] Frequency of Dental evaluation: Q6 months Frequency of Eye evaluation: wears eyeglasses, goes to [facility]  Lab Results  Component Value Date   CREATININE 1.45 (H) 03/17/2018   BUN 11 03/17/2018   NA 141 03/17/2018   K 4.3 03/17/2018   CL 102 03/17/2018   CO2 22 03/17/2018     Depression screen PHQ 2/9 05/03/2019 03/17/2018 10/28/2017  Decreased Interest 0 0 0  Down, Depressed, Hopeless 0 0 0  PHQ - 2 Score 0 0 0    Fall Risk  05/03/2019 03/17/2018 03/11/2017 07/08/2016 06/27/2016  Falls in the past year? 0 No No No No  Number falls in past yr: 0 - - - -  Injury with Fall? 0 - - - -     No Known Allergies  Prior to Admission medications   Medication Sig Start Date End Date Taking? Authorizing Provider  OVER THE COUNTER MEDICATION Fibergummies taking 2 a day   Yes [provider]  OVER THE COUNTER MEDICATION 5 Alive gummies taking 2 a day   Yes [provider]  OVER THE COUNTER MEDICATION OTC Iron tablets taking 2 a day    [provider]    Past Medical History:  Diagnosis Date  . Ulcer     Past Surgical History:  Procedure Laterality Date  . 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 TUBAL LIGATION  89    Social  History   Tobacco Use  . Smoking status: Never Smoker  . Smokeless tobacco: Never Used  Substance Use Topics  . Alcohol use: Yes    Alcohol/week: 0.0 standard drinks    Comment: 4 drinks    Family History  Problem Relation Age of Onset  . Hypertension Mother   . Hypercholesterolemia Father   . Colon cancer Maternal Grandmother     Review of Systems  Constitutional: Negative for chills and fever.  Respiratory: Negative for cough and shortness of breath.   Cardiovascular: Negative for chest pain, palpitations and leg swelling.  Gastrointestinal: Negative for abdominal pain, nausea and vomiting.  All other systems reviewed and are negative.    OBJECTIVE:  Today's Vitals   05/03/19 1530 05/03/19 1618  BP: 140/88 122/82  Pulse: 73   Temp: 98.1 F (36.7 C)   SpO2: 97%   Weight: 147 lb (66.7 kg)   Height: 5' 5.5" (1.664 m)    Body mass index is 24.09 kg/m.  BP Readings from Last 3 Encounters:  05/03/19 140/88  03/17/18 116/76  10/28/17 124/78   Physical Exam Vitals signs and nursing note reviewed. Exam conducted with a chaperone present.  Constitutional:      Appearance: She is well-developed.  HENT:     Head: Normocephalic and atraumatic.  Right Ear: Hearing, tympanic membrane, ear canal and external ear normal.     Left Ear: Hearing, tympanic membrane, ear canal and external ear normal.     Mouth/Throat:     Mouth: Mucous membranes are moist.     Pharynx: No oropharyngeal exudate or posterior oropharyngeal erythema.  Eyes:     Extraocular Movements: Extraocular movements intact.     Conjunctiva/sclera: Conjunctivae normal.     Pupils: Pupils are equal, round, and reactive to light.  Neck:     Musculoskeletal: Neck supple.     Thyroid: No thyromegaly.  Cardiovascular:     Rate and Rhythm: Normal rate and regular rhythm.     Heart sounds: Normal heart sounds. No murmur. No friction rub. No gallop.   Pulmonary:     Effort: Pulmonary effort is normal.      Breath sounds: Normal breath sounds. No wheezing, rhonchi or rales.  Chest:     Breasts:        Right: No mass, nipple discharge or skin change.        Left: No mass, nipple discharge or skin change.  Abdominal:     General: Bowel sounds are normal. There is no distension.     Palpations: Abdomen is soft. There is no hepatomegaly, splenomegaly or mass.     Tenderness: There is no abdominal tenderness.  Musculoskeletal: Normal range of motion.     Right lower leg: No edema.     Left lower leg: No edema.  Lymphadenopathy:     Cervical: No cervical adenopathy.     Upper Body:     Right upper body: No supraclavicular, axillary or pectoral adenopathy.     Left upper body: No supraclavicular, axillary or pectoral adenopathy.  Skin:    General: Skin is warm and dry.  Neurological:     Mental Status: She is alert and oriented to person, place, and time.     Cranial Nerves: No cranial nerve deficit.     Gait: Gait normal.     Deep Tendon Reflexes: Reflexes are normal and symmetric.  Psychiatric:        Mood and Affect: Mood normal.        Behavior: Behavior normal.     No results found for this or any previous visit (from the past 24 hour(s)).  No results found.   ASSESSMENT and PLAN  1. Annual physical exam No concerns per history or exam. Routine HCM labs ordered. HCM reviewed/discussed. Anticipatory guidance regarding healthy weight, lifestyle and choices given.  - Care order/instruction:  2. Donor of kidney for transplant - CMP14+EGFR - Urinalysis, Routine w reflex microscopic  3. S/p nephrectomy - CMP14+EGFR - Urinalysis, Routine w reflex microscopic  4. Need for lipid screening - Lipid panel  5. Screening for metabolic disorder - ZJI96+VELF  6. Screening for thyroid disorder - TSH  7. Screening for deficiency anemia - CBC  8. Screening for hematuria or proteinuria - Urinalysis, Routine w reflex microscopic  No follow-ups on file.    Rutherford Guys,  MD [facility] at [place],  [address] Ph.  [phone] Fax [phone]

## 2019-05-04 LAB — MICROSCOPIC EXAMINATION
Bacteria, UA: NONE SEEN
Casts: NONE SEEN /lpf

## 2019-05-04 LAB — LIPID PANEL
Chol/HDL Ratio: 2.1 ratio (ref 0.0–4.4)
Cholesterol, Total: 205 mg/dL — ABNORMAL HIGH (ref 100–199)
HDL: 100 mg/dL (ref >39)
LDL Chol Calc (NIH): 92 mg/dL (ref 0–99)
Triglycerides: 73 mg/dL (ref 0–149)
VLDL Cholesterol Cal: 13 mg/dL (ref 5–40)

## 2019-05-04 LAB — CBC
Hematocrit: 37.9 % (ref 34.0–46.6)
Hemoglobin: 12.4 g/dL (ref 11.1–15.9)
MCH: 29 pg (ref 26.6–33.0)
MCHC: 32.7 g/dL (ref 31.5–35.7)
MCV: 89 fL (ref 79–97)
Platelets: 164 10*3/uL (ref 150–450)
RBC: 4.28 x10E6/uL (ref 3.77–5.28)
RDW: 12.4 % (ref 11.7–15.4)
WBC: 4.8 10*3/uL (ref 3.4–10.8)

## 2019-05-04 LAB — CMP14+EGFR
ALT: 11 IU/L (ref 0–32)
AST: 20 IU/L (ref 0–40)
Albumin/Globulin Ratio: 1.7 (ref 1.2–2.2)
Albumin: 4.5 g/dL (ref 3.8–4.9)
Alkaline Phosphatase: 88 IU/L (ref 39–117)
BUN/Creatinine Ratio: 9 (ref 9–23)
BUN: 14 mg/dL (ref 6–24)
Bilirubin Total: 0.4 mg/dL (ref 0.0–1.2)
CO2: 25 mmol/L (ref 20–29)
Calcium: 9.4 mg/dL (ref 8.7–10.2)
Chloride: 102 mmol/L (ref 96–106)
Creatinine, Ser: 1.5 mg/dL — ABNORMAL HIGH (ref 0.57–1.00)
GFR calc Af Amer: 45 mL/min/{1.73_m2} — ABNORMAL LOW (ref >59)
GFR calc non Af Amer: 39 mL/min/{1.73_m2} — ABNORMAL LOW (ref >59)
Globulin, Total: 2.7 g/dL (ref 1.5–4.5)
Glucose: 85 mg/dL (ref 65–99)
Potassium: 4.1 mmol/L (ref 3.5–5.2)
Sodium: 140 mmol/L (ref 134–144)
Total Protein: 7.2 g/dL (ref 6.0–8.5)

## 2019-05-04 LAB — URINALYSIS, ROUTINE W REFLEX MICROSCOPIC
Bilirubin, UA: NEGATIVE
Glucose, UA: NEGATIVE
Ketones, UA: NEGATIVE
Nitrite, UA: NEGATIVE
Protein,UA: NEGATIVE
RBC, UA: NEGATIVE
Specific Gravity, UA: 1.005 (ref 1.005–1.030)
Urobilinogen, Ur: 0.2 mg/dL (ref 0.2–1.0)
pH, UA: 7 (ref 5.0–7.5)

## 2019-05-04 LAB — TSH: TSH: 1.3 u[IU]/mL (ref 0.450–4.500)

## 2019-05-09 ENCOUNTER — Encounter: Payer: Self-pay | Admitting: Family Medicine

## 2019-05-10 ENCOUNTER — Encounter: Payer: Self-pay | Admitting: Family Medicine

## 2019-05-17 ENCOUNTER — Other Ambulatory Visit: Payer: Self-pay | Admitting: Family Medicine

## 2019-05-17 ENCOUNTER — Encounter: Payer: BC Managed Care – PPO | Admitting: Family Medicine

## 2019-05-17 ENCOUNTER — Other Ambulatory Visit: Payer: Self-pay

## 2019-05-17 DIAGNOSIS — Z1231 Encounter for screening mammogram for malignant neoplasm of breast: Secondary | ICD-10-CM

## 2019-07-05 ENCOUNTER — Other Ambulatory Visit: Payer: Self-pay

## 2019-07-05 ENCOUNTER — Ambulatory Visit
Admission: RE | Admit: 2019-07-05 | Discharge: 2019-07-05 | Disposition: A | Discharge status: Home / Self Care | Payer: BC Managed Care – PPO | Source: Ambulatory Visit | Attending: Family Medicine | Admitting: Family Medicine

## 2019-07-05 DIAGNOSIS — Z1231 Encounter for screening mammogram for malignant neoplasm of breast: Secondary | ICD-10-CM

## 2020-04-23 ENCOUNTER — Other Ambulatory Visit: Payer: Self-pay

## 2020-04-23 ENCOUNTER — Ambulatory Visit (INDEPENDENT_AMBULATORY_CARE_PROVIDER_SITE_OTHER): Payer: BC Managed Care – PPO | Admitting: Family Medicine

## 2020-04-23 DIAGNOSIS — Z0000 Encounter for general adult medical examination without abnormal findings: Secondary | ICD-10-CM | POA: Diagnosis not present

## 2020-04-23 LAB — LIPID PANEL

## 2020-04-24 LAB — CMP14+EGFR
ALT: 13 IU/L (ref 0–32)
AST: 19 IU/L (ref 0–40)
Albumin/Globulin Ratio: 1.7 (ref 1.2–2.2)
Albumin: 4.7 g/dL (ref 3.8–4.9)
Alkaline Phosphatase: 85 IU/L (ref 44–121)
BUN/Creatinine Ratio: 14 (ref 9–23)
BUN: 22 mg/dL (ref 6–24)
Bilirubin Total: 0.3 mg/dL (ref 0.0–1.2)
CO2: 23 mmol/L (ref 20–29)
Calcium: 9.8 mg/dL (ref 8.7–10.2)
Chloride: 106 mmol/L (ref 96–106)
Creatinine, Ser: 1.53 mg/dL — ABNORMAL HIGH (ref 0.57–1.00)
GFR calc Af Amer: 44 mL/min/{1.73_m2} — ABNORMAL LOW (ref >59)
GFR calc non Af Amer: 38 mL/min/{1.73_m2} — ABNORMAL LOW (ref >59)
Globulin, Total: 2.8 g/dL (ref 1.5–4.5)
Glucose: 88 mg/dL (ref 65–99)
Potassium: 4.8 mmol/L (ref 3.5–5.2)
Sodium: 142 mmol/L (ref 134–144)
Total Protein: 7.5 g/dL (ref 6.0–8.5)

## 2020-04-24 LAB — CBC WITH DIFFERENTIAL/PLATELET
Basophils Absolute: 0 10*3/uL (ref 0.0–0.2)
Basos: 1 %
EOS (ABSOLUTE): 0.1 10*3/uL (ref 0.0–0.4)
Eos: 1 %
Hematocrit: 39.5 % (ref 34.0–46.6)
Hemoglobin: 12.8 g/dL (ref 11.1–15.9)
Immature Grans (Abs): 0 10*3/uL (ref 0.0–0.1)
Immature Granulocytes: 0 %
Lymphocytes Absolute: 1.2 10*3/uL (ref 0.7–3.1)
Lymphs: 25 %
MCH: 28.5 pg (ref 26.6–33.0)
MCHC: 32.4 g/dL (ref 31.5–35.7)
MCV: 88 fL (ref 79–97)
Monocytes Absolute: 0.3 10*3/uL (ref 0.1–0.9)
Monocytes: 6 %
Neutrophils Absolute: 3.4 10*3/uL (ref 1.4–7.0)
Neutrophils: 67 %
Platelets: 158 10*3/uL (ref 150–450)
RBC: 4.49 x10E6/uL (ref 3.77–5.28)
RDW: 12.1 % (ref 11.7–15.4)
WBC: 4.9 10*3/uL (ref 3.4–10.8)

## 2020-04-24 LAB — LIPID PANEL
Chol/HDL Ratio: 2.4 ratio (ref 0.0–4.4)
Cholesterol, Total: 208 mg/dL — ABNORMAL HIGH (ref 100–199)
HDL: 86 mg/dL (ref >39)
LDL Chol Calc (NIH): 109 mg/dL — ABNORMAL HIGH (ref 0–99)
Triglycerides: 72 mg/dL (ref 0–149)
VLDL Cholesterol Cal: 13 mg/dL (ref 5–40)

## 2020-04-24 LAB — TSH: TSH: 1.1 u[IU]/mL (ref 0.450–4.500)

## 2020-04-27 ENCOUNTER — Ambulatory Visit (INDEPENDENT_AMBULATORY_CARE_PROVIDER_SITE_OTHER): Payer: BC Managed Care – PPO | Admitting: Family Medicine

## 2020-04-27 ENCOUNTER — Other Ambulatory Visit: Payer: Self-pay

## 2020-04-27 ENCOUNTER — Other Ambulatory Visit (HOSPITAL_COMMUNITY)
Admission: RE | Admit: 2020-04-27 | Discharge: 2020-04-27 | Disposition: A | Discharge status: Home / Self Care | Payer: BC Managed Care – PPO | Source: Ambulatory Visit | Attending: Family Medicine | Admitting: Family Medicine

## 2020-04-27 ENCOUNTER — Encounter: Payer: Self-pay | Admitting: Family Medicine

## 2020-04-27 VITALS — BP 133/85 | HR 62 | Temp 98.2°F | Resp 16 | Ht 65.5 in | Wt 146.2 lb

## 2020-04-27 DIAGNOSIS — Z23 Encounter for immunization: Secondary | ICD-10-CM | POA: Diagnosis not present

## 2020-04-27 DIAGNOSIS — R87610 Atypical squamous cells of undetermined significance on cytologic smear of cervix (ASC-US): Secondary | ICD-10-CM | POA: Diagnosis not present

## 2020-04-27 DIAGNOSIS — Z0001 Encounter for general adult medical examination with abnormal findings: Secondary | ICD-10-CM | POA: Diagnosis not present

## 2020-04-27 DIAGNOSIS — N1832 Chronic kidney disease, stage 3b: Secondary | ICD-10-CM | POA: Insufficient documentation

## 2020-04-27 DIAGNOSIS — Z1231 Encounter for screening mammogram for malignant neoplasm of breast: Secondary | ICD-10-CM | POA: Diagnosis not present

## 2020-04-27 DIAGNOSIS — Z0000 Encounter for general adult medical examination without abnormal findings: Secondary | ICD-10-CM

## 2020-04-27 DIAGNOSIS — Z905 Acquired absence of kidney: Secondary | ICD-10-CM

## 2020-04-27 NOTE — Progress Notes (Signed)
10/8/20213:45 PM  Lacey Herrera 04-26-1966, 54 y.o., female [id number]  Chief Complaint  Patient presents with  . Annual Exam    pt feeling well no concerns labs drawn monday     HPI:   Patient is a 54 y.o. female  who presents today for CPE  Last CPE Oct 2020 Cervical Cancer Screening: 2018 pos ASCUS neg HPV, repeat pap/hpv in 2021 Breast Cancer Screening: dec 2020 Colorectal Cancer Screening: 2014, repeat in 10 years, Dr Collene Mares Bone Density Testing: at age 73 Seasonal Influenza Vaccination: annually Td/Tdap Vaccination: 2015 Pneumococcal Vaccination: at age 78 Zoster Vaccination: [facility] Covid vaccine: has completed 2 doses Frequency of Dental evaluation: Q6 months Frequency of Eye evaluation: wears eyeglasses, goes to [facility]  Lab Results  Component Value Date   WBC 4.9 04/23/2020   HGB 12.8 04/23/2020   HCT 39.5 04/23/2020   MCV 88 04/23/2020   PLT 158 04/23/2020   Lab Results  Component Value Date   CREATININE 1.53 (H) 04/23/2020   BUN 22 04/23/2020   NA 142 04/23/2020   K 4.8 04/23/2020   CL 106 04/23/2020   CO2 23 04/23/2020  GFR 44  Lab Results  Component Value Date   ALT 13 04/23/2020   AST 19 04/23/2020   ALKPHOS 85 04/23/2020   BILITOT 0.3 04/23/2020   Lab Results  Component Value Date   CHOL 208 (H) 04/23/2020   HDL 86 04/23/2020   LDLCALC 109 (H) 04/23/2020   TRIG 72 04/23/2020   CHOLHDL 2.4 04/23/2020   Lab Results  Component Value Date   TSH 1.100 04/23/2020    Depression screen PHQ 2/9 04/27/2020 05/03/2019 03/17/2018  Decreased Interest 0 0 0  Down, Depressed, Hopeless 0 0 0  PHQ - 2 Score 0 0 0    Fall Risk  04/27/2020 05/03/2019 03/17/2018 03/11/2017 07/08/2016  Falls in the past year? 0 0 No No No  Number falls in past yr: 0 0 - - -  Injury with Fall? 0 0 - - -  Risk for fall due to : No Fall Risks - - - -  Follow up Falls evaluation completed - - - -     No Known Allergies  Prior to Admission  medications   Medication Sig Start Date End Date Taking? Authorizing Provider  Misc Natural Products (ELDERBERRY ZINC/VIT C/IMMUNE MT) Use as directed in the mouth or throat.   Yes [provider]  OVER THE COUNTER MEDICATION 5 Alive gummies taking 2 a day   Yes [provider]    Past Medical History:  Diagnosis Date  . Ulcer     Past Surgical History:  Procedure Laterality Date  . 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 TUBAL LIGATION  89    Social History   Tobacco Use  . Smoking status: Never Smoker  . Smokeless tobacco: Never Used  Substance Use Topics  . Alcohol use: Yes    Alcohol/week: 0.0 standard drinks    Comment: 1 drink or less    Family History  Problem Relation Age of Onset  . Hypertension Mother   . Hypercholesterolemia Father   . Colon cancer Maternal Grandmother     Review of Systems  Constitutional: Negative for chills, fever and malaise/fatigue.  HENT: Negative for hearing loss and tinnitus.   Eyes: Negative for blurred vision and double vision.  Respiratory: Negative for cough and shortness of breath.   Cardiovascular: Negative for chest pain,  palpitations and leg swelling.  Gastrointestinal: Negative for abdominal pain, blood in stool, constipation, diarrhea, melena, nausea and vomiting.  Genitourinary: Negative for dysuria and hematuria.  Musculoskeletal: Negative for joint pain and myalgias.  Neurological: Negative for dizziness, tingling, focal weakness and headaches.  Endo/Heme/Allergies: Negative for polydipsia.  Psychiatric/Behavioral: Negative for depression. The patient is not nervous/anxious and does not have insomnia.      OBJECTIVE:  Today's Vitals   04/27/20 1502  BP: 133/85  Pulse: 62  Resp: 16  Temp: 98.2 F (36.8 C)  TempSrc: Temporal  SpO2: 98%  Weight: 146 lb 3.2 oz (66.3 kg)  Height: 5' 5.5" (1.664 m)   Body mass index is 23.96 kg/m.   Physical Exam Vitals and nursing note reviewed. Exam  conducted with a chaperone present.  Constitutional:      Appearance: She is well-developed.  HENT:     Head: Normocephalic and atraumatic.     Right Ear: Hearing, tympanic membrane, ear canal and external ear normal.     Left Ear: Hearing, tympanic membrane, ear canal and external ear normal.  Eyes:     Extraocular Movements: Extraocular movements intact.     Conjunctiva/sclera: Conjunctivae normal.     Pupils: Pupils are equal, round, and reactive to light.  Neck:     Thyroid: No thyromegaly.  Cardiovascular:     Rate and Rhythm: Normal rate and regular rhythm.     Pulses: Normal pulses.     Heart sounds: Normal heart sounds. No murmur heard.  No friction rub. No gallop.   Pulmonary:     Effort: Pulmonary effort is normal.     Breath sounds: Normal breath sounds. No wheezing, rhonchi or rales.  Chest:     Breasts: Breasts are symmetrical.        Right: No inverted nipple, mass, nipple discharge, skin change or tenderness.        Left: No inverted nipple, mass, nipple discharge, skin change or tenderness.  Abdominal:     General: Bowel sounds are normal. There is no distension.     Palpations: Abdomen is soft. There is no hepatomegaly, splenomegaly or mass.     Tenderness: There is no abdominal tenderness. There is no guarding.     Hernia: No hernia is present.  Genitourinary:    Labia:        Right: No rash or lesion.        Left: No rash or lesion.      Vagina: No vaginal discharge or erythema.     Cervix: No cervical motion tenderness, discharge or friability.     Uterus: Not enlarged, not fixed and not tender.      Adnexa:        Right: No mass or tenderness.         Left: No mass or tenderness.    Musculoskeletal:        General: Normal range of motion.     Cervical back: Neck supple.     Right lower leg: No edema.     Left lower leg: No edema.  Lymphadenopathy:     Cervical: No cervical adenopathy.     Upper Body:     Right upper body: No supraclavicular,  axillary or pectoral adenopathy.     Left upper body: No supraclavicular, axillary or pectoral adenopathy.  Skin:    General: Skin is warm and dry.  Neurological:     General: No focal deficit present.     Mental Status:  She is alert and oriented to person, place, and time.     Cranial Nerves: No cranial nerve deficit.     Gait: Gait normal.     Deep Tendon Reflexes: Reflexes are normal and symmetric.  Psychiatric:        Mood and Affect: Mood normal.        Behavior: Behavior normal.     No results found for this or any previous visit (from the past 24 hour(s)).  No results found.   ASSESSMENT and PLAN  1. Annual physical exam No concerns per history or exam. Routine HCM labs reviewed. HCM reviewed/discussed. Anticipatory guidance regarding healthy weight, lifestyle and choices given.   2. ASCUS of cervix with negative high risk HPV - Cytology - PAP  3. Visit for screening mammogram - MM DIGITAL SCREENING BILATERAL; Future  4. Need for immunization against influenza  5. S/p nephrectomy 6. Stage 3b chronic kidney disease Stable. Discussed nephrotoxins, discussed BP goal, low salt diet.  Other orders - Misc Natural Products (ELDERBERRY ZINC/VIT C/IMMUNE MT); Use as directed in the mouth or throat. - Flu Vaccine QUAD 6+ mos PF IM (Fluarix Quad PF)  Return in about 1 year (around 04/27/2021).    Rutherford Guys, MD [facility] at [place], [place] [address] Ph.  [phone] Fax [phone]

## 2020-04-27 NOTE — Patient Instructions (Addendum)
   If you have lab work done today you will be contacted with your lab results within the next 2 weeks.  If you have not heard from us then please contact us. The fastest way to get your results is to register for My Chart.   IF you received an x-ray today, you will receive an invoice from [facility]. Please contact  Radiology at [phone] with questions or concerns regarding your invoice.   IF you received labwork today, you will receive an invoice from [facility]. Please contact [facility] at [phone] with questions or concerns regarding your invoice.   Our billing staff will not be able to assist you with questions regarding bills from these companies.  You will be contacted with the lab results as soon as they are available. The fastest way to get your results is to activate your My Chart account. Instructions are located on the last page of this paperwork. If you have not heard from us regarding the results in 2 weeks, please contact this office.     Preventive Care 40-64 Years Old, Female Preventive care refers to visits with your health care provider and lifestyle choices that can promote health and wellness. This includes:  A yearly physical exam. This may also be called an annual well check.  Regular dental visits and eye exams.  Immunizations.  Screening for certain conditions.  Healthy lifestyle choices, such as eating a healthy diet, getting regular exercise, not using drugs or products that contain nicotine and tobacco, and limiting alcohol use. What can I expect for my preventive care visit? Physical exam Your health care provider will check your:  Height and weight. This may be used to calculate body mass index (BMI), which tells if you are at a healthy weight.  Heart rate and blood pressure.  Skin for abnormal spots. Counseling Your health care provider may ask you questions about your:  Alcohol, tobacco, and drug use.  Emotional  well-being.  Home and relationship well-being.  Sexual activity.  Eating habits.  Work and work environment.  Method of birth control.  Menstrual cycle.  Pregnancy history. What immunizations do I need?  Influenza (flu) vaccine  This is recommended every year. Tetanus, diphtheria, and pertussis (Tdap) vaccine  You may need a Td booster every 10 years. Varicella (chickenpox) vaccine  You may need this if you have not been vaccinated. Zoster (shingles) vaccine  You may need this after age 60. Measles, mumps, and rubella (MMR) vaccine  You may need at least one dose of MMR if you were born in 1957 or later. You may also need a second dose. Pneumococcal conjugate (PCV13) vaccine  You may need this if you have certain conditions and were not previously vaccinated. Pneumococcal polysaccharide (PPSV23) vaccine  You may need one or two doses if you smoke cigarettes or if you have certain conditions. Meningococcal conjugate (MenACWY) vaccine  You may need this if you have certain conditions. Hepatitis A vaccine  You may need this if you have certain conditions or if you travel or work in places where you may be exposed to hepatitis A. Hepatitis B vaccine  You may need this if you have certain conditions or if you travel or work in places where you may be exposed to hepatitis B. Haemophilus influenzae type b (Hib) vaccine  You may need this if you have certain conditions. Human papillomavirus (HPV) vaccine  If recommended by your health care provider, you may need three doses over 6 months.   You may receive vaccines as individual doses or as more than one vaccine together in one shot (combination vaccines). Talk with your health care provider about the risks and benefits of combination vaccines. What tests do I need? Blood tests  Lipid and cholesterol levels. These may be checked every 5 years, or more frequently if you are over 50 years old.  Hepatitis C  test.  Hepatitis B test. Screening  Lung cancer screening. You may have this screening every year starting at age 55 if you have a 30-pack-year history of smoking and currently smoke or have quit within the past 15 years.  Colorectal cancer screening. All adults should have this screening starting at age 50 and continuing until age 75. Your health care provider may recommend screening at age 45 if you are at increased risk. You will have tests every 1-10 years, depending on your results and the type of screening test.  Diabetes screening. This is done by checking your blood sugar (glucose) after you have not eaten for a while (fasting). You may have this done every 1-3 years.  Mammogram. This may be done every 1-2 years. Talk with your health care provider about when you should start having regular mammograms. This may depend on whether you have a family history of breast cancer.  BRCA-related cancer screening. This may be done if you have a family history of breast, ovarian, tubal, or peritoneal cancers.  Pelvic exam and Pap test. This may be done every 3 years starting at age 21. Starting at age 30, this may be done every 5 years if you have a Pap test in combination with an HPV test. Other tests  Sexually transmitted disease (STD) testing.  Bone density scan. This is done to screen for osteoporosis. You may have this scan if you are at high risk for osteoporosis. Follow these instructions at home: Eating and drinking  Eat a diet that includes fresh fruits and vegetables, whole grains, lean protein, and low-fat dairy.  Take vitamin and mineral supplements as recommended by your health care provider.  Do not drink alcohol if: ? Your health care provider tells you not to drink. ? You are pregnant, may be pregnant, or are planning to become pregnant.  If you drink alcohol: ? Limit how much you have to 0-1 drink a day. ? Be aware of how much alcohol is in your drink. In the [place] one  drink equals one 12 oz bottle of beer (355 mL), one 5 oz glass of wine (148 mL), or one 1 oz glass of hard liquor (44 mL). Lifestyle  Take daily care of your teeth and gums.  Stay active. Exercise for at least 30 minutes on 5 or more days each week.  Do not use any products that contain nicotine or tobacco, such as cigarettes, e-cigarettes, and chewing tobacco. If you need help quitting, ask your health care provider.  If you are sexually active, practice safe sex. Use a condom or other form of birth control (contraception) in order to prevent pregnancy and STIs (sexually transmitted infections).  If told by your health care provider, take low-dose aspirin daily starting at age 50. What's next?  Visit your health care provider once a year for a well check visit.  Ask your health care provider how often you should have your eyes and teeth checked.  Stay up to date on all vaccines. This information is not intended to replace advice given to you by your health care provider. Make sure   you discuss any questions you have with your health care provider. Document Revised: 03/18/2018 Document Reviewed: 03/18/2018 Elsevier Patient Education  2020 Elsevier Inc.  

## 2020-05-01 ENCOUNTER — Encounter: Payer: Self-pay | Admitting: Radiology

## 2020-05-01 LAB — CYTOLOGY - PAP
Comment: NEGATIVE
Diagnosis: NEGATIVE
High risk HPV: NEGATIVE

## 2020-05-31 ENCOUNTER — Other Ambulatory Visit: Payer: Self-pay | Admitting: Family Medicine

## 2020-05-31 DIAGNOSIS — Z1231 Encounter for screening mammogram for malignant neoplasm of breast: Secondary | ICD-10-CM

## 2020-07-11 ENCOUNTER — Ambulatory Visit
Admission: RE | Admit: 2020-07-11 | Discharge: 2020-07-11 | Disposition: A | Discharge status: Home / Self Care | Payer: BC Managed Care – PPO | Source: Ambulatory Visit | Attending: Family Medicine | Admitting: Family Medicine

## 2020-07-11 ENCOUNTER — Other Ambulatory Visit: Payer: Self-pay

## 2020-07-11 DIAGNOSIS — Z1231 Encounter for screening mammogram for malignant neoplasm of breast: Secondary | ICD-10-CM

## 2020-07-12 ENCOUNTER — Ambulatory Visit: Payer: BC Managed Care – PPO

## 2020-08-06 ENCOUNTER — Encounter: Payer: Self-pay | Admitting: Family Medicine

## 2020-08-06 ENCOUNTER — Telehealth: Payer: BC Managed Care – PPO | Admitting: Family Medicine

## 2020-08-06 VITALS — Ht 65.5 in | Wt 130.0 lb

## 2020-08-06 DIAGNOSIS — I10 Essential (primary) hypertension: Secondary | ICD-10-CM

## 2020-08-06 DIAGNOSIS — E7800 Pure hypercholesterolemia, unspecified: Secondary | ICD-10-CM | POA: Diagnosis not present

## 2020-08-06 DIAGNOSIS — Z905 Acquired absence of kidney: Secondary | ICD-10-CM | POA: Diagnosis not present

## 2020-08-06 DIAGNOSIS — N1832 Chronic kidney disease, stage 3b: Secondary | ICD-10-CM

## 2020-08-06 NOTE — Patient Instructions (Addendum)
Preventing High Cholesterol Cholesterol is a white, waxy substance similar to fat that the human body needs to help build cells. The liver makes all the cholesterol that a person's body needs. Having high cholesterol (hypercholesterolemia) increases your risk for heart disease and stroke. Extra or excess cholesterol comes from the food that you eat. High cholesterol can often be prevented with diet and lifestyle changes. If you already have high cholesterol, you can control it with diet, lifestyle changes, and medicines. How can high cholesterol affect me? If you have high cholesterol, fatty deposits (plaques) may build up on the walls of your blood vessels. The blood vessels that carry blood away from your heart are called arteries. Plaques make the arteries narrower and stiffer. This in turn can:  Restrict or block blood flow and cause blood clots to form.  Increase your risk for heart attack and stroke. What can increase my risk for high cholesterol? This condition is more likely to develop in people who:  Eat foods that are high in saturated fat or cholesterol. Saturated fat is mostly found in foods that come from animal sources.  Are overweight.  Are not getting enough exercise.  Have a family history of high cholesterol (familial hypercholesterolemia). What actions can I take to prevent this? Nutrition  Eat less saturated fat.  Avoid trans fats (partially hydrogenated oils). These are often found in margarine and in some baked goods, fried foods, and snacks bought in packages.  Avoid precooked or cured meat, such as bacon, sausages, or meat loaves.  Avoid foods and drinks that have added sugars.  Eat more fruits, vegetables, and whole grains.  Choose healthy sources of protein, such as fish, poultry, lean cuts of red meat, beans, peas, lentils, and nuts.  Choose healthy sources of fat, such as: ? Nuts. ? Vegetable oils, especially olive oil. ? Fish that have healthy  fats, such as omega-3 fatty acids. These fish include mackerel or salmon.   Lifestyle  Lose weight if you are overweight. Maintaining a healthy body mass index (BMI) can help prevent or control high cholesterol. It can also lower your risk for diabetes and high blood pressure. Ask your health care provider to help you with a diet and exercise plan to lose weight safely.  Do not use any products that contain nicotine or tobacco, such as cigarettes, e-cigarettes, and chewing tobacco. If you need help quitting, ask your health care provider. Alcohol use  Do not drink alcohol if: ? Your health care provider tells you not to drink. ? You are pregnant, may be pregnant, or are planning to become pregnant.  If you drink alcohol: ? Limit how much you use to:  0-1 drink a day for women.  0-2 drinks a day for men. ? Be aware of how much alcohol is in your drink. In the [place] one drink equals one 12 oz bottle of beer (355 mL), one 5 oz glass of wine (148 mL), or one 1 oz glass of hard liquor (44 mL). Activity  Get enough exercise. Do exercises as told by your health care provider.  Each week, do at least 150 minutes of exercise that takes a medium level of effort (moderate-intensity exercise). This kind of exercise: ? Makes your heart beat faster while allowing you to still be able to talk. ? Can be done in short sessions several times a day or longer sessions a few times a week. For example, on 5 days each week, you could walk  fast or ride your bike 3 times a day for 10 minutes each time.   Medicines  Your health care provider may recommend medicines to help lower cholesterol. This may be a medicine to lower the amount of cholesterol that your liver makes. You may need medicine if: ? Diet and lifestyle changes have not lowered your cholesterol enough. ? You have high cholesterol and other risk factors for heart disease or stroke.  Take over-the-counter and prescription medicines only as told by  your health care provider. General information  Manage your risk factors for high cholesterol. Talk with your health care provider about all your risk factors and how to lower your risk.  Manage other conditions that you have, such as diabetes or high blood pressure (hypertension).  Have blood tests to check your cholesterol levels at regular points in time as told by your health care provider.  Keep all follow-up visits as told by your health care provider. This is important. Where to find more information  American Heart Association: www.heart.org  National Heart, Lung, and Blood Institute: https://wilson-eaton.com/ Summary  High cholesterol increases your risk for heart disease and stroke. By keeping your cholesterol level low, you can reduce your risk for these conditions.  High cholesterol can often be prevented with diet and lifestyle changes.  Work with your health care provider to manage your risk factors, and have your blood tested regularly. This information is not intended to replace advice given to you by your health care provider. Make sure you discuss any questions you have with your health care provider. Document Revised: 04/19/2019 Document Reviewed: 04/19/2019 Elsevier Patient Education  2021 [place] Your Hypertension Hypertension, also called high blood pressure, is when the force of the blood pressing against the walls of the arteries is too strong. Arteries are blood vessels that carry blood from your heart throughout your body. Hypertension forces the heart to work harder to pump blood and may cause the arteries to become narrow or stiff. Understanding blood pressure readings Your personal target blood pressure may vary depending on your medical conditions, your age, and other factors. A blood pressure reading includes a higher number over a lower number. Ideally, your blood pressure should be below 120/80. You should know that:  The first, or top, number  is called the systolic pressure. It is a measure of the pressure in your arteries as your heart beats.  The second, or bottom number, is called the diastolic pressure. It is a measure of the pressure in your arteries as the heart relaxes. Blood pressure is classified into four stages. Based on your blood pressure reading, your health care provider may use the following stages to determine what type of treatment you need, if any. Systolic pressure and diastolic pressure are measured in a unit called mmHg. Normal  Systolic pressure: below 284.  Diastolic pressure: below 80. Elevated  Systolic pressure: 132-440.  Diastolic pressure: below 80. Hypertension stage 1  Systolic pressure: 102-725.  Diastolic pressure: 36-64. Hypertension stage 2  Systolic pressure: 403 or above.  Diastolic pressure: 90 or above. How can this condition affect me? Managing your hypertension is an important responsibility. Over time, hypertension can damage the arteries and decrease blood flow to important parts of the body, including the brain, heart, and kidneys. Having untreated or uncontrolled hypertension can lead to:  A heart attack.  A stroke.  A weakened blood vessel (aneurysm).  Heart failure.  Kidney damage.  Eye damage.  Metabolic syndrome.  Memory and concentration problems.  Vascular dementia. What actions can I take to manage this condition? Hypertension can be managed by making lifestyle changes and possibly by taking medicines. Your health care provider will help you make a plan to bring your blood pressure within a normal range. Nutrition  Eat a diet that is high in fiber and potassium, and low in salt (sodium), added sugar, and fat. An example eating plan is called the Dietary Approaches to Stop Hypertension (DASH) diet. To eat this way: ? Eat plenty of fresh fruits and vegetables. Try to fill one-half of your plate at each meal with fruits and vegetables. ? Eat whole grains,  such as whole-wheat pasta, brown rice, or whole-grain bread. Fill about one-fourth of your plate with whole grains. ? Eat low-fat dairy products. ? Avoid fatty cuts of meat, processed or cured meats, and poultry with skin. Fill about one-fourth of your plate with lean proteins such as fish, chicken without skin, beans, eggs, and tofu. ? Avoid pre-made and processed foods. These tend to be higher in sodium, added sugar, and fat.  Reduce your daily sodium intake. Most people with hypertension should eat less than 1,500 mg of sodium a day.   Lifestyle  Work with your health care provider to maintain a healthy body weight or to lose weight. Ask what an ideal weight is for you.  Get at least 30 minutes of exercise that causes your heart to beat faster (aerobic exercise) most days of the week. Activities may include walking, swimming, or biking.  Include exercise to strengthen your muscles (resistance exercise), such as weight lifting, as part of your weekly exercise routine. Try to do these types of exercises for 30 minutes at least 3 days a week.  Do not use any products that contain nicotine or tobacco, such as cigarettes, e-cigarettes, and chewing tobacco. If you need help quitting, ask your health care provider.  Control any long-term (chronic) conditions you have, such as high cholesterol or diabetes.  Identify your sources of stress and find ways to manage stress. This may include meditation, deep breathing, or making time for fun activities.   Alcohol use  Do not drink alcohol if: ? Your health care provider tells you not to drink. ? You are pregnant, may be pregnant, or are planning to become pregnant.  If you drink alcohol: ? Limit how much you use to:  0-1 drink a day for women.  0-2 drinks a day for men. ? Be aware of how much alcohol is in your drink. In the [place] one drink equals one 12 oz bottle of beer (355 mL), one 5 oz glass of wine (148 mL), or one 1 oz glass of hard  liquor (44 mL). Medicines Your health care provider may prescribe medicine if lifestyle changes are not enough to get your blood pressure under control and if:  Your systolic blood pressure is 130 or higher.  Your diastolic blood pressure is 80 or higher. Take medicines only as told by your health care provider. Follow the directions carefully. Blood pressure medicines must be taken as told by your health care provider. The medicine does not work as well when you skip doses. Skipping doses also puts you at risk for problems. Monitoring Before you monitor your blood pressure:  Do not smoke, drink caffeinated beverages, or exercise within 30 minutes before taking a measurement.  Use the bathroom and empty your bladder (urinate).  Sit quietly for at least 5 minutes before taking measurements.  Monitor your blood pressure at home as told by your health care provider. To do this:  Sit with your back straight and supported.  Place your feet flat on the floor. Do not cross your legs.  Support your arm on a flat surface, such as a table. Make sure your upper arm is at heart level.  Each time you measure, take two or three readings one minute apart and record the results. You may also need to have your blood pressure checked regularly by your health care provider.   General information  Talk with your health care provider about your diet, exercise habits, and other lifestyle factors that may be contributing to hypertension.  Review all the medicines you take with your health care provider because there may be side effects or interactions.  Keep all visits as told by your health care provider. Your health care provider can help you create and adjust your plan for managing your high blood pressure. Where to find more information  National Heart, Lung, and Blood Institute: https://wilson-eaton.com/  American Heart Association: www.heart.org Contact a health care provider if:  You think you are  having a reaction to medicines you have taken.  You have repeated (recurrent) headaches.  You feel dizzy.  You have swelling in your ankles.  You have trouble with your vision. Get help right away if:  You develop a severe headache or confusion.  You have unusual weakness or numbness, or you feel faint.  You have severe pain in your chest or abdomen.  You vomit repeatedly.  You have trouble breathing. These symptoms may represent a serious problem that is an emergency. Do not wait to see if the symptoms will go away. Get medical help right away. Call your local emergency services (911 in the [place] Do not drive yourself to the hospital. Summary  Hypertension is when the force of blood pumping through your arteries is too strong. If this condition is not controlled, it may put you at risk for serious complications.  Your personal target blood pressure may vary depending on your medical conditions, your age, and other factors. For most people, a normal blood pressure is less than 120/80.  Hypertension is managed by lifestyle changes, medicines, or both.  Lifestyle changes to help manage hypertension include losing weight, eating a healthy, low-sodium diet, exercising more, stopping smoking, and limiting alcohol. This information is not intended to replace advice given to you by your health care provider. Make sure you discuss any questions you have with your health care provider. Document Revised: 08/12/2019 Document Reviewed: 06/07/2019 Elsevier Patient Education  2021 [employer].  If you have lab work done today you will be contacted with your lab results within the next 2 weeks.  If you have not heard from [place] then please contact us. The fastest way to get your results is to register for My Chart.   IF you received an x-ray today, you will receive an invoice from [facility]. Please contact [facility] at [phone] with questions or concerns regarding your  invoice.   IF you received labwork today, you will receive an invoice from [facility]. Please contact [facility] at [phone] with questions or concerns regarding your invoice.   Our billing staff will not be able to assist you with questions regarding bills from these companies.  You will be contacted with the lab results as soon as they are available. The fastest way to get your results is to activate your My Chart account. Instructions are  located on the last page of this paperwork. If you have not heard from [place] regarding the results in 2 weeks, please contact this office.

## 2020-08-06 NOTE — Progress Notes (Addendum)
Virtual Visit Note  I connected with patient on 08/06/20 at 0829 by telephone due to unable to work Epic video visit and verified that I am speaking with the correct person using two identifiers. Lacey Herrera is currently located at home and her daughter is  currently with her during this visit. The provider, Laurita Quint Moncia Annas, FNP is located in their office at time of visit.  I discussed the limitations, risks, security and privacy concerns of performing an evaluation and management service by telephone and the availability of in person appointments. I also discussed with the patient that there may be a patient responsible charge related to this service. The patient expressed understanding and agreed to proceed.   I provided 30 minutes of non-face-to-face time during this encounter.  Chief Complaint  Patient presents with  . Establish Care    Established care. Patient is not having any issues to discuss at this time    HPI ? 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 Has lost weight since then Works for [employer] 2.5-1.5 mile per day Increased anxiety since her husbands passing 3 daughters (33, 28, 79) all live in [place] Strong support system  Last CPE Oct 2021 Cervical Cancer Screening:04/2020 (nilm and HPV neg) Breast Cancer Screening:Dec 2021 (Birad1) Colorectal Cancer Screening:2014, repeat in 10 years, Dr Loistine Simas Testing:at age 53 Seasonal Influenza Vaccination:annually Td/Tdap Vaccination:2015 Pneumococcal Vaccination:at age 24 Zoster Vaccination:has completed 2 doses at [facility] Covid vaccine: has completed 2 doses Frequency of Dental evaluation: Q6 months Frequency of Eye evaluation:wears eyeglasses, goes to [facility]  BP Readings from Last 3 Encounters:  04/27/20 133/85  05/03/19 122/82  03/17/18 116/76    The 10-year ASCVD risk score Mikey Bussing DC Jr., et al., 2013) is: 2.8%   Values used to calculate the  score:     Age: 55 years     Sex: Female     Is Non-Hispanic African American: Yes     Diabetic: No     Tobacco smoker: No     Systolic Blood Pressure: 035 mmHg     Is BP treated: No     HDL Cholesterol: 86 mg/dL     Total Cholesterol: 208 mg/dL   No Known Allergies  Prior to Admission medications   Medication Sig Start Date End Date Taking? Authorizing Provider  Misc Natural Products (ELDERBERRY ZINC/VIT C/IMMUNE MT) Use as directed in the mouth or throat.    [provider]  OVER THE COUNTER MEDICATION 5 Alive gummies taking 2 a day    [provider]    Past Medical History:  Diagnosis Date  . Anemia    Phreesia 08/05/2020  . GERD (gastroesophageal reflux disease)    Phreesia 08/05/2020  . Hyperlipidemia    Phreesia 08/05/2020  . Ulcer     Past Surgical History:  Procedure Laterality Date  . 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 TUBAL LIGATION  89    Social History   Tobacco Use  . Smoking status: Never Smoker  . Smokeless tobacco: Never Used  Substance Use Topics  . Alcohol use: Yes    Alcohol/week: 0.0 standard drinks    Comment: 1 drink or less    Family History  Problem Relation Age of Onset  . Hypertension Mother   . Hypercholesterolemia Father   . Colon cancer Maternal Grandmother     Review of Systems  Constitutional: Negative for chills, fever and malaise/fatigue.  Eyes: Negative for blurred vision and double vision.  Respiratory: Negative for cough, shortness of breath and wheezing.   Cardiovascular: Negative for chest pain, palpitations and leg swelling.  Gastrointestinal: Negative for abdominal pain, blood in stool, constipation, diarrhea, heartburn, nausea and vomiting.  Genitourinary: Negative for dysuria, frequency and hematuria.  Musculoskeletal: Negative for back pain and joint pain.  Skin: Negative for rash.  Neurological: Negative for dizziness, weakness and headaches.  Psychiatric/Behavioral: Negative for  depression. The patient is nervous/anxious.     Objective  Constitutional:      General: Not in acute distress.    Appearance: Normal appearance. Not ill-appearing.   Pulmonary:     Effort: Pulmonary effort is normal. No respiratory distress.  Neurological:     Mental Status: Alert and oriented to person, place, and time.  Psychiatric:        Mood and Affect: Mood normal.        Behavior: Behavior normal.    ASSESSMENT and PLAN  Problem List Items Addressed This Visit      Genitourinary   Stage 3b chronic kidney disease ([place]) - Primary     Other   S/p nephrectomy    Other Visit Diagnoses    Pure hypercholesterolemia       Essential hypertension         Plan  Monitor BP at home daily for goal< 130/80  Limit nephrotoxic medications due to hx nephrectomy  Monitor diet for elevated cholesterol  Get Covid Booster when able    Return in about 9 months (around 05/06/2021).    The above assessment and management plan was discussed with the patient. The patient verbalized understanding of and has agreed to the management plan. Patient is aware to call the clinic if symptoms persist or worsen. Patient is aware when to return to the clinic for a follow-up visit. Patient educated on when it is appropriate to go to the emergency department.     Huston Foley Eleyna Brugh, FNP-BC [facility], [place] [address] Ph.  [phone] Fax [phone]   I have reviewed and agree with above documentation. Agustina Caroli, MD

## 2021-07-16 ENCOUNTER — Other Ambulatory Visit: Payer: Self-pay | Admitting: Nurse Practitioner

## 2021-07-16 DIAGNOSIS — Z1231 Encounter for screening mammogram for malignant neoplasm of breast: Secondary | ICD-10-CM

## 2021-07-17 ENCOUNTER — Ambulatory Visit
Admission: RE | Admit: 2021-07-17 | Discharge: 2021-07-17 | Disposition: A | Discharge status: Home / Self Care | Payer: BC Managed Care – PPO | Source: Ambulatory Visit | Attending: Nurse Practitioner | Admitting: Nurse Practitioner

## 2021-07-17 DIAGNOSIS — Z1231 Encounter for screening mammogram for malignant neoplasm of breast: Secondary | ICD-10-CM

## 2021-07-19 ENCOUNTER — Other Ambulatory Visit: Payer: Self-pay | Admitting: Nurse Practitioner

## 2021-07-19 DIAGNOSIS — R928 Other abnormal and inconclusive findings on diagnostic imaging of breast: Secondary | ICD-10-CM

## 2021-07-23 ENCOUNTER — Telehealth (INDEPENDENT_AMBULATORY_CARE_PROVIDER_SITE_OTHER): Payer: BC Managed Care – PPO | Admitting: Nurse Practitioner

## 2021-07-23 ENCOUNTER — Encounter: Payer: Self-pay | Admitting: Nurse Practitioner

## 2021-07-23 DIAGNOSIS — Z7689 Persons encountering health services in other specified circumstances: Secondary | ICD-10-CM | POA: Diagnosis not present

## 2021-07-23 NOTE — Progress Notes (Signed)
Virtual Visit via Video Note  I connected with Lacey Herrera on 07/23/21 at  8:00 AM EST by a video enabled telemedicine application and verified that I am speaking with the correct person using two identifiers.  Location: Patient: home Provider: office   I discussed the limitations of evaluation and management by telemedicine and the availability of in person appointments. The patient expressed understanding and agreed to proceed.  History of Present Illness:   Patient presents today through video visit to establish care.  She states that she is not currently on any prescription medications.  She does take a multivitamin/immune supplement.  Patient has no significant health history other than she is a kidney donor.  She donated a kidney to her late husband.  As this was a previous patient of [facility].  Patient states that overall she has been doing well and has no new issues or concerns.  She does still follow with [facility] for routine checkups after donating her kidney. Denies f/c/s, n/v/d, hemoptysis, PND, chest pain or edema.      Observations/Objective:  Vitals with BMI 08/06/2020 04/27/2020 05/03/2019  Height 5' 5.5" 5' 5.5" -  Weight 130 lbs 146 lbs 3 oz -  BMI 48.5 46.27 -  Systolic - 035 009  Diastolic - 85 82  Pulse - 62 -      Assessment and Plan:  Encounter to establish care:  Heart healthy diet  Stay active  Stay well hydrated   Follow up:  Follow up in 6 months with Dr. Redmond Pulling or Amy    I discussed the assessment and treatment plan with the patient. The patient was provided an opportunity to ask questions and all were answered. The patient agreed with the plan and demonstrated an understanding of the instructions.   The patient was advised to call back or seek an in-person evaluation if the symptoms worsen or if the condition fails to improve as anticipated.  I provided 23 minutes of non-face-to-face time during this encounter.   Fenton Foy, NP

## 2021-07-23 NOTE — Patient Instructions (Signed)
Encounter to establish care:  Heart healthy diet  Stay active  Stay well hydrated   Follow up:  Follow up in 6 months with Dr. Redmond Pulling or Amy

## 2021-08-23 ENCOUNTER — Ambulatory Visit
Admission: RE | Admit: 2021-08-23 | Discharge: 2021-08-23 | Disposition: A | Discharge status: Home / Self Care | Payer: BC Managed Care – PPO | Source: Ambulatory Visit | Attending: Nurse Practitioner | Admitting: Nurse Practitioner

## 2021-08-23 ENCOUNTER — Ambulatory Visit: Payer: BC Managed Care – PPO

## 2021-08-23 DIAGNOSIS — R928 Other abnormal and inconclusive findings on diagnostic imaging of breast: Secondary | ICD-10-CM

## 2021-08-27 ENCOUNTER — Other Ambulatory Visit: Payer: BC Managed Care – PPO

## 2021-12-23 ENCOUNTER — Ambulatory Visit: Payer: BC Managed Care – PPO | Admitting: Family Medicine

## 2022-01-06 ENCOUNTER — Ambulatory Visit: Payer: BC Managed Care – PPO | Admitting: Family Medicine

## 2022-02-12 ENCOUNTER — Ambulatory Visit: Payer: BC Managed Care – PPO | Admitting: Family Medicine

## 2022-02-12 ENCOUNTER — Encounter: Payer: Self-pay | Admitting: Family Medicine

## 2022-02-12 VITALS — BP 136/80 | HR 67 | Temp 98.1°F | Resp 16 | Wt 139.8 lb

## 2022-02-12 DIAGNOSIS — Z7689 Persons encountering health services in other specified circumstances: Secondary | ICD-10-CM

## 2022-02-12 DIAGNOSIS — K2980 Duodenitis without bleeding: Secondary | ICD-10-CM | POA: Insufficient documentation

## 2022-02-12 DIAGNOSIS — D509 Iron deficiency anemia, unspecified: Secondary | ICD-10-CM | POA: Insufficient documentation

## 2022-02-12 DIAGNOSIS — Z130 Encounter for screening for diseases of the blood and blood-forming organs and certain disorders involving the immune mechanism: Secondary | ICD-10-CM

## 2022-02-12 DIAGNOSIS — B9681 Helicobacter pylori [H. pylori] as the cause of diseases classified elsewhere: Secondary | ICD-10-CM | POA: Insufficient documentation

## 2022-02-12 DIAGNOSIS — Z800 Family history of malignant neoplasm of digestive organs: Secondary | ICD-10-CM | POA: Insufficient documentation

## 2022-02-12 DIAGNOSIS — Z13220 Encounter for screening for lipoid disorders: Secondary | ICD-10-CM

## 2022-02-12 DIAGNOSIS — Z1211 Encounter for screening for malignant neoplasm of colon: Secondary | ICD-10-CM | POA: Insufficient documentation

## 2022-02-12 DIAGNOSIS — Z0000 Encounter for general adult medical examination without abnormal findings: Secondary | ICD-10-CM

## 2022-02-12 DIAGNOSIS — K219 Gastro-esophageal reflux disease without esophagitis: Secondary | ICD-10-CM | POA: Insufficient documentation

## 2022-02-12 DIAGNOSIS — K921 Melena: Secondary | ICD-10-CM | POA: Insufficient documentation

## 2022-02-13 LAB — CBC WITH DIFFERENTIAL/PLATELET
Basophils Absolute: 0 10*3/uL (ref 0.0–0.2)
Basos: 0 %
EOS (ABSOLUTE): 0.1 10*3/uL (ref 0.0–0.4)
Eos: 2 %
Hematocrit: 37.2 % (ref 34.0–46.6)
Hemoglobin: 12.5 g/dL (ref 11.1–15.9)
Immature Grans (Abs): 0 10*3/uL (ref 0.0–0.1)
Immature Granulocytes: 0 %
Lymphocytes Absolute: 1.6 10*3/uL (ref 0.7–3.1)
Lymphs: 35 %
MCH: 28.9 pg (ref 26.6–33.0)
MCHC: 33.6 g/dL (ref 31.5–35.7)
MCV: 86 fL (ref 79–97)
Monocytes Absolute: 0.4 10*3/uL (ref 0.1–0.9)
Monocytes: 9 %
Neutrophils Absolute: 2.4 10*3/uL (ref 1.4–7.0)
Neutrophils: 54 %
Platelets: 160 10*3/uL (ref 150–450)
RBC: 4.32 x10E6/uL (ref 3.77–5.28)
RDW: 12.4 % (ref 11.7–15.4)
WBC: 4.5 10*3/uL (ref 3.4–10.8)

## 2022-02-13 LAB — CMP14+EGFR
ALT: 9 IU/L (ref 0–32)
AST: 22 IU/L (ref 0–40)
Albumin/Globulin Ratio: 1.7 (ref 1.2–2.2)
Albumin: 4.5 g/dL (ref 3.8–4.9)
Alkaline Phosphatase: 90 IU/L (ref 44–121)
BUN/Creatinine Ratio: 13 (ref 9–23)
BUN: 18 mg/dL (ref 6–24)
Bilirubin Total: 0.4 mg/dL (ref 0.0–1.2)
CO2: 23 mmol/L (ref 20–29)
Calcium: 9.5 mg/dL (ref 8.7–10.2)
Chloride: 104 mmol/L (ref 96–106)
Creatinine, Ser: 1.36 mg/dL — ABNORMAL HIGH (ref 0.57–1.00)
Globulin, Total: 2.7 g/dL (ref 1.5–4.5)
Glucose: 86 mg/dL (ref 70–99)
Potassium: 4.6 mmol/L (ref 3.5–5.2)
Sodium: 141 mmol/L (ref 134–144)
Total Protein: 7.2 g/dL (ref 6.0–8.5)
eGFR: 45 mL/min/{1.73_m2} — ABNORMAL LOW (ref >59)

## 2022-02-13 LAB — LIPID PANEL
Chol/HDL Ratio: 2.3 ratio (ref 0.0–4.4)
Cholesterol, Total: 216 mg/dL — ABNORMAL HIGH (ref 100–199)
HDL: 94 mg/dL (ref >39)
LDL Chol Calc (NIH): 112 mg/dL — ABNORMAL HIGH (ref 0–99)
Triglycerides: 59 mg/dL (ref 0–149)
VLDL Cholesterol Cal: 10 mg/dL (ref 5–40)

## 2022-02-13 LAB — VITAMIN D 25 HYDROXY (VIT D DEFICIENCY, FRACTURES): Vit D, 25-Hydroxy: 47.1 ng/mL (ref 30.0–100.0)

## 2022-02-13 LAB — HEMOGLOBIN A1C
Est. average glucose Bld gHb Est-mCnc: 100 mg/dL
Hgb A1c MFr Bld: 5.1 % (ref 4.8–5.6)

## 2022-02-13 LAB — TSH: TSH: 0.877 u[IU]/mL (ref 0.450–4.500)

## 2022-02-15 ENCOUNTER — Encounter: Payer: Self-pay | Admitting: Family Medicine

## 2022-02-15 NOTE — Progress Notes (Signed)
Established Patient Office Visit  Subjective    Patient ID: Lacey Herrera, female    DOB: 10/04/1965  Age: 56 y.o. MRN: [id number]  CC: No chief complaint on file.   HPI Lacey Herrera presents to establish care with a new provider and for routine annual exam. Patient denies acute complaints or concerns.    Outpatient Encounter Medications as of 02/12/2022  Medication Sig   gabapentin (NEURONTIN) 100 MG capsule Take 1 capsule by mouth 3 (three) times daily.   Misc Natural Products (ELDERBERRY ZINC/VIT C/IMMUNE MT) Use as directed in the mouth or throat.   Multiple Vitamins-Minerals (MULTIVITAMIN ADULT EXTRA C PO) Multivitamin   No facility-administered encounter medications on file as of 02/12/2022.    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   TUBAL LIGATION  89    Family History  Problem Relation Age of Onset   Hypertension Mother    Hypercholesterolemia Father    Colon cancer Maternal Grandmother     Social History   Socioeconomic History   Marital status: Widowed    Spouse name: Not on file   Number of children: 3   Years of education: Not on file   Highest education level: Some college, no degree  Occupational History   Occupation: Therapist, nutritional  Tobacco Use   Smoking status: Never   Smokeless tobacco: Never  Vaping Use   Vaping Use: Never used  Substance and Sexual Activity   Alcohol use: Yes    Alcohol/week: 0.0 standard drinks of alcohol    Comment: 1 drink or less   Drug use: No   Sexual activity: Yes    Partners: Male    Comment: with monogamous partner   Other Topics Concern   Not on file  Social History Narrative   Pt is from [place], [place].    Moved to [place] in 1990.   Married for 27 years.   3  children (all girls).   Has a good social group.    Social Determinants of Health   Financial Resource Strain: Low Risk  (05/03/2019)   Overall Financial Resource Strain (CARDIA)    Difficulty of Paying Living Expenses: Not hard at all  Food Insecurity: No Food Insecurity (05/03/2019)   Hunger Vital Sign    Worried About Running Out of Food in the Last Year: Never true    Ran Out of Food in the Last Year: Never true  Transportation Needs: No Transportation Needs (05/03/2019)   PRAPARE - Hydrologist (Medical): No    Lack of Transportation (Non-Medical): No  Physical Activity: Sufficiently Active (05/03/2019)   Exercise Vital Sign    Days of Exercise per Week: 7 days    Minutes of Exercise per Session: 30 min  Stress: No Stress Concern Present (05/03/2019)   [place]    Feeling of Stress : Not at all  Social Connections: [place] (05/03/2019)   Social Connection and Isolation Panel [NHANES]    Frequency of Communication with Friends and Family: More than three times a week    Frequency of Social Gatherings with Friends and Family: More than three times a week    Attends  Religious Services: More than 4 times per year    Active Member of Clubs or Organizations: Yes    Attends Archivist Meetings: More than 4 times per year    Marital Status: Married  Human resources officer Violence: Not At Risk (05/03/2019)   Humiliation, Afraid, Rape, and Kick questionnaire    Fear of Current or Ex-Partner: No    Emotionally Abused: No    Physically Abused: No    Sexually Abused: No    Review of Systems  All other systems reviewed and are negative.       Objective    BP 136/80   Pulse 67   Temp 98.1 F (36.7 C) (Oral)   Resp 16   Wt 139 lb 12.8 oz (63.4 kg)   SpO2 95%   BMI 22.91 kg/m   Physical Exam Vitals and nursing note reviewed.  Constitutional:      General:  She is not in acute distress. HENT:     Head: Normocephalic and atraumatic.     Right Ear: Tympanic membrane, ear canal and external ear normal.     Left Ear: Tympanic membrane, ear canal and external ear normal.     Nose: Nose normal.     Mouth/Throat:     Mouth: Mucous membranes are moist.     Pharynx: Oropharynx is clear.  Eyes:     Conjunctiva/sclera: Conjunctivae normal.     Pupils: Pupils are equal, round, and reactive to light.  Neck:     Thyroid: No thyromegaly.  Cardiovascular:     Rate and Rhythm: Normal rate and regular rhythm.     Heart sounds: Normal heart sounds. No murmur heard. Pulmonary:     Effort: Pulmonary effort is normal. No respiratory distress.     Breath sounds: Normal breath sounds.  Abdominal:     General: There is no distension.     Palpations: Abdomen is soft. There is no mass.     Tenderness: There is no abdominal tenderness.  Musculoskeletal:        General: Normal range of motion.     Cervical back: Normal range of motion and neck supple.  Skin:    General: Skin is warm and dry.  Neurological:     General: No focal deficit present.     Mental Status: She is alert and oriented to person, place, and time.  Psychiatric:        Mood and Affect: Mood normal.        Behavior: Behavior normal.         Assessment & Plan:   1. Annual physical exam  - CMP14+EGFR  2. Screening for deficiency anemia  - CBC with Differential  3. Screening for lipid disorders  - Lipid Panel  4. Screening for endocrine/metabolic/immunity disorders  - Hemoglobin A1c - Vitamin D, 25-hydroxy - TSH  5. Encounter to establish care     No follow-ups on file.   Becky Sax, MD

## 2022-05-19 ENCOUNTER — Other Ambulatory Visit: Payer: Self-pay | Admitting: Nurse Practitioner

## 2022-05-19 DIAGNOSIS — Z1231 Encounter for screening mammogram for malignant neoplasm of breast: Secondary | ICD-10-CM

## 2022-07-18 ENCOUNTER — Ambulatory Visit
Admission: RE | Admit: 2022-07-18 | Discharge: 2022-07-18 | Disposition: A | Discharge status: Home / Self Care | Payer: BC Managed Care – PPO | Source: Ambulatory Visit | Attending: Nurse Practitioner | Admitting: Nurse Practitioner

## 2022-07-18 DIAGNOSIS — Z1231 Encounter for screening mammogram for malignant neoplasm of breast: Secondary | ICD-10-CM

## 2023-05-04 ENCOUNTER — Other Ambulatory Visit: Payer: Self-pay | Admitting: Nurse Practitioner

## 2023-05-04 DIAGNOSIS — Z1231 Encounter for screening mammogram for malignant neoplasm of breast: Secondary | ICD-10-CM

## 2023-07-21 ENCOUNTER — Ambulatory Visit
Admission: RE | Admit: 2023-07-21 | Discharge: 2023-07-21 | Disposition: A | Discharge status: Home / Self Care | Payer: BC Managed Care – PPO | Source: Ambulatory Visit | Attending: Nurse Practitioner

## 2023-07-21 DIAGNOSIS — Z1231 Encounter for screening mammogram for malignant neoplasm of breast: Secondary | ICD-10-CM

## 2024-07-06 ENCOUNTER — Other Ambulatory Visit: Payer: Self-pay | Admitting: Family Medicine

## 2024-07-06 DIAGNOSIS — Z1231 Encounter for screening mammogram for malignant neoplasm of breast: Secondary | ICD-10-CM

## 2024-07-28 ENCOUNTER — Ambulatory Visit
Admission: RE | Admit: 2024-07-28 | Discharge: 2024-07-28 | Disposition: A | Discharge status: Home / Self Care | Payer: Self-pay | Source: Ambulatory Visit | Attending: Family Medicine | Admitting: Family Medicine

## 2024-07-28 DIAGNOSIS — Z1231 Encounter for screening mammogram for malignant neoplasm of breast: Secondary | ICD-10-CM

## 2024-08-01 ENCOUNTER — Ambulatory Visit: Payer: Self-pay | Admitting: Family Medicine

## 2024-09-12 ENCOUNTER — Encounter: Payer: Self-pay | Admitting: Family Medicine
